# Patient Record
Sex: Male | Born: 1945
Health system: Southern US, Community
[De-identification: ages and names within clinical notes are randomized; demographics above are authoritative.]

## PROBLEM LIST (undated history)

## (undated) DIAGNOSIS — M1990 Unspecified osteoarthritis, unspecified site: Secondary | ICD-10-CM

## (undated) DIAGNOSIS — R011 Cardiac murmur, unspecified: Secondary | ICD-10-CM

## (undated) DIAGNOSIS — N529 Male erectile dysfunction, unspecified: Secondary | ICD-10-CM

## (undated) DIAGNOSIS — D126 Benign neoplasm of colon, unspecified: Secondary | ICD-10-CM

## (undated) DIAGNOSIS — K219 Gastro-esophageal reflux disease without esophagitis: Secondary | ICD-10-CM

## (undated) DIAGNOSIS — K5790 Diverticulosis of intestine, part unspecified, without perforation or abscess without bleeding: Secondary | ICD-10-CM

## (undated) DIAGNOSIS — K648 Other hemorrhoids: Secondary | ICD-10-CM

## (undated) DIAGNOSIS — G5790 Unspecified mononeuropathy of unspecified lower limb: Secondary | ICD-10-CM

## (undated) DIAGNOSIS — G4730 Sleep apnea, unspecified: Secondary | ICD-10-CM

## (undated) DIAGNOSIS — E785 Hyperlipidemia, unspecified: Secondary | ICD-10-CM

## (undated) DIAGNOSIS — I10 Essential (primary) hypertension: Secondary | ICD-10-CM

## (undated) DIAGNOSIS — K602 Anal fissure, unspecified: Secondary | ICD-10-CM

## (undated) DIAGNOSIS — N400 Enlarged prostate without lower urinary tract symptoms: Secondary | ICD-10-CM

## (undated) HISTORY — DX: Benign neoplasm of colon, unspecified: D12.6

## (undated) HISTORY — DX: Hyperlipidemia, unspecified: E78.5

## (undated) HISTORY — DX: Unspecified mononeuropathy of unspecified lower limb: G57.90

## (undated) HISTORY — DX: Anal fissure, unspecified: K60.2

## (undated) HISTORY — PX: ANAL FISSURE REPAIR: SHX2312

## (undated) HISTORY — DX: Male erectile dysfunction, unspecified: N52.9

## (undated) HISTORY — DX: Unspecified osteoarthritis, unspecified site: M19.90

## (undated) HISTORY — PX: FOOT FRACTURE SURGERY: SHX645

## (undated) HISTORY — DX: Other hemorrhoids: K64.8

## (undated) HISTORY — PX: JOINT REPLACEMENT: SHX530

## (undated) HISTORY — DX: Benign prostatic hyperplasia without lower urinary tract symptoms: N40.0

## (undated) HISTORY — DX: Diverticulosis of intestine, part unspecified, without perforation or abscess without bleeding: K57.90

---

## 2000-05-05 ENCOUNTER — Emergency Department (HOSPITAL_COMMUNITY): Admission: EM | Admit: 2000-05-05 | Discharge: 2000-05-05 | Payer: Self-pay | Admitting: Emergency Medicine

## 2000-05-05 ENCOUNTER — Encounter: Payer: Self-pay | Admitting: Emergency Medicine

## 2002-12-09 ENCOUNTER — Encounter: Payer: Self-pay | Admitting: Specialist

## 2002-12-14 ENCOUNTER — Encounter: Payer: Self-pay | Admitting: Specialist

## 2002-12-16 ENCOUNTER — Inpatient Hospital Stay (HOSPITAL_COMMUNITY): Admission: RE | Admit: 2002-12-16 | Discharge: 2002-12-20 | Payer: Self-pay | Admitting: Specialist

## 2004-06-26 ENCOUNTER — Ambulatory Visit (HOSPITAL_COMMUNITY): Admission: RE | Admit: 2004-06-26 | Discharge: 2004-06-26 | Payer: Self-pay | Admitting: Internal Medicine

## 2004-06-27 ENCOUNTER — Ambulatory Visit (HOSPITAL_COMMUNITY): Admission: RE | Admit: 2004-06-27 | Discharge: 2004-06-27 | Payer: Self-pay | Admitting: Internal Medicine

## 2004-07-03 ENCOUNTER — Encounter: Admission: RE | Admit: 2004-07-03 | Discharge: 2004-07-03 | Payer: Self-pay | Admitting: Orthopedic Surgery

## 2005-06-13 ENCOUNTER — Ambulatory Visit (HOSPITAL_COMMUNITY): Admission: RE | Admit: 2005-06-13 | Discharge: 2005-06-13 | Payer: Self-pay | Admitting: Orthopaedic Surgery

## 2007-12-27 ENCOUNTER — Ambulatory Visit: Payer: Self-pay | Admitting: Internal Medicine

## 2008-01-10 ENCOUNTER — Ambulatory Visit: Payer: Self-pay | Admitting: Gastroenterology

## 2008-02-19 ENCOUNTER — Inpatient Hospital Stay (HOSPITAL_COMMUNITY): Admission: EM | Admit: 2008-02-19 | Discharge: 2008-02-20 | Payer: Self-pay | Admitting: Emergency Medicine

## 2008-05-22 ENCOUNTER — Ambulatory Visit: Payer: Self-pay | Admitting: Vascular Surgery
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
## 2011-04-01 NOTE — Procedures (Signed)
DUPLEX DEEP VENOUS EXAM - LOWER EXTREMITY   INDICATION:  Left calf pain and swelling.   HISTORY:  Edema:  No.  Trauma/Surgery:  Hit medial left calf on bulldozer one week ago.  Pain:  Left calf.  PE:  No.  Previous DVT:  No.  Anticoagulants:  Other:   DUPLEX EXAM:                CFV   SFV   PopV  PTV    GSV                R  L  R  L  R  L  R   L  R  L  Thrombosis    o  o     o     o      o     o  Spontaneous   +  +     +     +      +     +  Phasic        +  +     +     +      +     +  Augmentation  +  +     +     +      +     +  Compressible  +  +     +     +      +     +  Competent   Legend:  + - yes  o - no  p - partial  D - decreased   IMPRESSION:  1. No evidence of deep venous thrombosis noted in the left lower      extremity.  2. Cystic structure with internal debris measuring 4 x 3.4 x 1.3 cm,      noted at the medial mid-to-distal left calf level, which is most      likely a hematoma.  3. Homogenous echo-textured structure with cystic components measuring      6 x 3.6 x 3.6 cm, noted in the left popliteal fossa, which is most      likely evidence of a ruptured baker's cyst.   Results were called and given to Myrene Buddy at Dr. Laurey Morale office at 1:50  on 05/22/08.    _____________________________  Janetta Hora. Fields, MD   CH/MEDQ  D:  05/22/2008  T:  05/22/2008  Job:  [id number]
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
## 2011-08-12 LAB — POCT I-STAT, CHEM 8
Chloride: 105
Creatinine, Ser: 1.3
Sodium: 138
TCO2: 22

## 2011-08-12 LAB — COMPREHENSIVE METABOLIC PANEL
ALT: 36
Albumin: 3.8
BUN: 10
Chloride: 102
Creatinine, Ser: 0.78
GFR calc Af Amer: >60
Glucose, Bld: 105 — ABNORMAL HIGH
Sodium: 135

## 2011-08-12 LAB — CBC
HCT: 41.6
Hemoglobin: 11.1 — ABNORMAL LOW
MCHC: 34.7
MCV: 92.3
MCV: 93.1
Platelets: 146 — ABNORMAL LOW
RDW: 13.3

## 2011-12-02 ENCOUNTER — Encounter (HOSPITAL_COMMUNITY): Payer: Self-pay | Admitting: Pharmacy Technician

## 2011-12-03 ENCOUNTER — Other Ambulatory Visit: Payer: Self-pay | Admitting: Pain Medicine

## 2011-12-09 ENCOUNTER — Other Ambulatory Visit (HOSPITAL_COMMUNITY): Payer: BC Managed Care – PPO

## 2011-12-10 ENCOUNTER — Ambulatory Visit (HOSPITAL_COMMUNITY)
Admission: RE | Admit: 2011-12-10 | Discharge: 2011-12-10 | Disposition: A | Discharge status: Home / Self Care | Payer: BC Managed Care – PPO | Source: Ambulatory Visit | Attending: Specialist | Admitting: Specialist

## 2011-12-10 ENCOUNTER — Encounter (HOSPITAL_COMMUNITY)
Admission: RE | Admit: 2011-12-10 | Discharge: 2011-12-10 | Disposition: A | Discharge status: Home / Self Care | Payer: BC Managed Care – PPO | Source: Ambulatory Visit | Attending: Specialist | Admitting: Specialist

## 2011-12-10 ENCOUNTER — Encounter (HOSPITAL_COMMUNITY): Payer: Self-pay

## 2011-12-10 DIAGNOSIS — I517 Cardiomegaly: Secondary | ICD-10-CM | POA: Insufficient documentation

## 2011-12-10 DIAGNOSIS — Z01812 Encounter for preprocedural laboratory examination: Secondary | ICD-10-CM | POA: Insufficient documentation

## 2011-12-10 DIAGNOSIS — M1710 Unilateral primary osteoarthritis, unspecified knee: Secondary | ICD-10-CM | POA: Insufficient documentation

## 2011-12-10 DIAGNOSIS — Z01818 Encounter for other preprocedural examination: Secondary | ICD-10-CM | POA: Insufficient documentation

## 2011-12-10 HISTORY — DX: Sleep apnea, unspecified: G47.30

## 2011-12-10 HISTORY — DX: Essential (primary) hypertension: I10

## 2011-12-10 HISTORY — DX: Gastro-esophageal reflux disease without esophagitis: K21.9

## 2011-12-10 HISTORY — DX: Unspecified osteoarthritis, unspecified site: M19.90

## 2011-12-10 HISTORY — DX: Cardiac murmur, unspecified: R01.1

## 2011-12-10 LAB — CBC
HCT: 40.9 % (ref 39.0–52.0)
MCH: 32.6 pg (ref 26.0–34.0)
MCV: 92.5 fL (ref 78.0–100.0)
Platelets: 206 10*3/uL (ref 150–400)
RBC: 4.42 MIL/uL (ref 4.22–5.81)
RDW: 13.3 % (ref 11.5–15.5)
WBC: 6.5 10*3/uL (ref 4.0–10.5)

## 2011-12-10 LAB — URINALYSIS, ROUTINE W REFLEX MICROSCOPIC
Ketones, ur: NEGATIVE mg/dL
Leukocytes, UA: NEGATIVE
Nitrite: NEGATIVE
Protein, ur: NEGATIVE mg/dL
Urobilinogen, UA: 0.2 mg/dL (ref 0.0–1.0)

## 2011-12-10 LAB — COMPREHENSIVE METABOLIC PANEL
BUN: 16 mg/dL (ref 6–23)
CO2: 28 mEq/L (ref 19–32)
Calcium: 10.1 mg/dL (ref 8.4–10.5)
Chloride: 101 mEq/L (ref 96–112)
Creatinine, Ser: 0.81 mg/dL (ref 0.50–1.35)
GFR calc non Af Amer: >90 mL/min (ref >90)
Total Bilirubin: 0.4 mg/dL (ref 0.3–1.2)

## 2011-12-10 LAB — DIFFERENTIAL
Basophils Absolute: 0 10*3/uL (ref 0.0–0.1)
Basophils Relative: 1 % (ref 0–1)
Lymphocytes Relative: 21 % (ref 12–46)
Neutro Abs: 4.3 10*3/uL (ref 1.7–7.7)
Neutrophils Relative %: 65 % (ref 43–77)

## 2011-12-10 LAB — PROTIME-INR
INR: 0.97 (ref 0.00–1.49)
Prothrombin Time: 13.1 seconds (ref 11.6–15.2)

## 2011-12-10 MED ORDER — CEFAZOLIN SODIUM 1-5 GM-% IV SOLN: 1.0000 g | INTRAVENOUS | Status: DC

## 2011-12-10 NOTE — H&P (Signed)
NAME:  [name], [name] NO.:  [id number]  MEDICAL RECORD NO.:  [id number]  LOCATION:                               FACILITY:  [facility]  PHYSICIAN:  Erasmo Leventhal, M.D.DATE OF BIRTH:  03-22-1946  DATE OF ADMISSION:  12/12/2011 DATE OF DISCHARGE:                             HISTORY & PHYSICAL   CHIEF COMPLAINT:  Painful loss of range of motion to his right knee.  HISTORY OF PRESENT ILLNESS:  The patient is a 66 year old gentleman, well-known to Dr. Thomasena Edis for evaluation and treatment of his knee pain. The patient had a total knee arthroplasty on his left foot 9 years previous.  He has done real well.  The patient continued to have pain, difficulty with weight bearing, range of motion in his right knee.  He has failed conservative treatment.  The patient has done so well with his left knee.  He would like to proceed with a total knee arthroplasty on his right.  X-rays reveal he has significant arthritic change in the medial compartment with varus deformity and degenerative changes under his patella facet.  He has been cleared by his primary care physician, Dr. Rodrigo Ran.  He has also recently had a cardiac stress test by Dr. Donnie Aho.  PRIMARY CARE PHYSICIAN:  Mark A. Perini, MD  CARDIOLOGY:  Recent evaluation by Georga Hacking, MD  ALLERGIES:  None.  CURRENT MEDICATIONS: 1. Crestor 20 mg once a day. 2. Ziac 25 mg once a day. 3. __________ 10 mg once a day. 4. Lotrel 5/20 mg once a day. 5. Gabapentin 100 mg 3 times a day. 6. Fish oil 3 tablets a day. 7. Osteo Bi-Flex once a day. 8. Aspirin 81 mg a day on hold. 9. Vitamin B12 1000 mg a day. 10.Vitamin D3 1000 mg a day. 11.Cardura 4 mg tablets one to one-half tablet p.r.n. increased blood     pressure, does not use frequently.  PAST MEDICAL HISTORY: 1. Hypertension. 2. Sleep apnea with nasal cannula at 2 L, use nightly.  REVIEW OF SYSTEMS:  NEUROLOGIC:  He denies any strokes,  seizures, convulsions, numbness or tingling, impaired vision, or memory.  No alcohol or drug abuse in the past.  No anxiety or depression issues. PULMONARY:  He does have sleep apnea.  He just uses nasal cannula at night 2 L/minute.  He denies any problems with wheezing, productive cough, shortness of breath, or history of tuberculosis.  CARDIOVASCULAR: He denies any chest pains, irregular heart rhythms.  No shortness of breath with activity.  No PND.  No previous heart surgery or catheterizations.  He did have a stress test 3 weeks ago for this upcoming surgery which was unremarkable by Dr. Donnie Aho.  GI:  Denies any acid reflux, heartburn, indigestion, constipation, diarrhea.  No problems with his liver, gallbladder, or colon.  GU:  He denies any problems with burning on urination.  No prostate issues.  No kidney stones.  No kidney failure.  ENDOCRINE:  He denies any problems related to his thyroid or diabetes.  PAST SURGICAL HISTORY:  Left knee replacement 9 years previous without any complications.  He denies any issues with anesthesia.  FAMILY MEDICAL HISTORY:  Father  is deceased from a stroke at age of 36. Mother is deceased from heart disease at 93.  SOCIAL HISTORY:  He is married.  He has smoked in the past.  He has 3-4 alcoholic beverages daily.  He denies any issues related to withdrawal. He has got 3 grown children.  He will be returning home for postoperative care.  PHYSICAL EXAMINATION:  VITAL SIGNS:  Height is 5 feet 8 inches, weight is 255 pounds, blood pressure is 128/78, pulse of 70 and regular, respirations 12 nonlabored.  The patient is afebrile. GENERAL:  Short stature, heavy.  The patient is conscious, alert and appropriate, healthy-appearing gentleman, walks with a slight right- sided limp. HEENT:  Head is normocephalic.  Pupils equal.  Gross hearing is intact. NECK:  Supple.  Good range of motion.  No palpable lymphadenopathy. HEART:  Regular rate and rhythm.   No murmurs. LUNGS:  Clear throughout. ABDOMEN:  Round, soft, bowel sounds present. EXTREMITIES:  Upper extremities had good range of motion with good motor strength.  Lower extremities, he had good range of motion both hips. His right knee, he had painful full extension, he was able to flex it back to 120.  He had pain along the medial joint line.  No pain laterally.  He had no ligament instability.  Calf was soft and nontender.  His left knee had full extension, flexion to 120.  No ligament instability.  Calf was soft.  Good motion of both ankles. PERIPHERAL VASCULAR:  Carotid pulses were 2+.  No bruits.  Radial pulses were 2+.  Posterior tibial pulses were 1+.  He had no lower extremity edema noted. BREAST/RECTAL/GU:  Deferred at this time.  IMPRESSION: 1. Advanced osteoarthritis, right knee. 2. Hypertension. 3. Sleep apnea.  PLAN:  The patient will undergo all routine labs and tests prior to having a right total knee arthroplasty by Dr. Thomasena Edis on December 12, 2011.     Jamelle Rushing, P.A.   ______________________________ Erasmo Leventhal, M.D.    RWK/MEDQ  D:  12/10/2011  T:  12/10/2011  Job:  [id number]

## 2011-12-10 NOTE — Pre-Procedure Instructions (Signed)
12/10/11 Nurse added to consent - arthroplasty after right total knee.  Please verify with MD.

## 2011-12-10 NOTE — Patient Instructions (Signed)
20 [name]  12/10/2011   Your procedure is scheduled on:  12/12/11 1230pm-225pm  Report to [facility] at 1030 AM.  Call this number if you have problems the morning of surgery: [phone]   Remember:   Do not eat food:After Midnight.  May have clear liquids:until Midnight .    Take these medicines the morning of surgery with A SIP OF WATER:    Do not wear jewelry,   Do not wear lotions, powders, or perfumes.     Do not bring valuables to the hospital.  Contacts, dentures or bridgework may not be worn into surgery.  Leave suitcase in the car. After surgery it may be brought to your room.  For patients admitted to the hospital, checkout time is 11:00 AM the day of discharge.       Special Instructions: CHG Shower Use Special Wash: 1/2 bottle night before surgery and 1/2 bottle morning of surgery. shower chin to toes with CHG.  Wash face and private parts with regular soap.     Please read over the following fact sheets that you were given: MRSA Information, Incentive Spirometry Fact Sheet, Blood Transfusion Fact Sheet, coughing and deep breathing exercises, leg exercises

## 2011-12-10 NOTE — Pre-Procedure Instructions (Signed)
12/10/11 Consent form did not list reason except for had Dr Thomasena Edis name and procedure.  Nurse put under reason- osteoarthritis right knee.  Please verify with MD.

## 2011-12-10 NOTE — Pre-Procedure Instructions (Signed)
Office visit with cardiologist done 10/28/11 on chart - Dr Donnie Aho  10/30/11 Cardiolite results on chart  11/24/11 Office visit with PCP on chart

## 2011-12-12 ENCOUNTER — Encounter (HOSPITAL_COMMUNITY)
Admission: RE | Disposition: A | Discharge status: Home Health | Payer: Self-pay | Source: Ambulatory Visit | Attending: Specialist

## 2011-12-12 ENCOUNTER — Encounter (HOSPITAL_COMMUNITY): Payer: Self-pay | Admitting: *Deleted

## 2011-12-12 ENCOUNTER — Inpatient Hospital Stay (HOSPITAL_COMMUNITY)
Admission: RE | Admit: 2011-12-12 | Discharge: 2011-12-15 | DRG: 209 | Disposition: A | Discharge status: Home Health | Payer: BC Managed Care – PPO | Source: Ambulatory Visit | Attending: Specialist | Admitting: Specialist

## 2011-12-12 ENCOUNTER — Encounter (HOSPITAL_COMMUNITY): Payer: Self-pay | Admitting: Anesthesiology

## 2011-12-12 ENCOUNTER — Inpatient Hospital Stay (HOSPITAL_COMMUNITY): Payer: BC Managed Care – PPO | Admitting: Anesthesiology

## 2011-12-12 DIAGNOSIS — M1710 Unilateral primary osteoarthritis, unspecified knee: Principal | ICD-10-CM | POA: Diagnosis present

## 2011-12-12 DIAGNOSIS — I10 Essential (primary) hypertension: Secondary | ICD-10-CM | POA: Diagnosis present

## 2011-12-12 DIAGNOSIS — G4730 Sleep apnea, unspecified: Secondary | ICD-10-CM | POA: Diagnosis present

## 2011-12-12 DIAGNOSIS — Z87891 Personal history of nicotine dependence: Secondary | ICD-10-CM

## 2011-12-12 DIAGNOSIS — E871 Hypo-osmolality and hyponatremia: Secondary | ICD-10-CM | POA: Diagnosis present

## 2011-12-12 DIAGNOSIS — Z96659 Presence of unspecified artificial knee joint: Secondary | ICD-10-CM

## 2011-12-12 DIAGNOSIS — E785 Hyperlipidemia, unspecified: Secondary | ICD-10-CM | POA: Diagnosis present

## 2011-12-12 HISTORY — PX: TOTAL KNEE ARTHROPLASTY: SHX125

## 2011-12-12 LAB — TYPE AND SCREEN: Antibody Screen: NEGATIVE

## 2011-12-12 SURGERY — ARTHROPLASTY, KNEE, TOTAL
Anesthesia: General | Site: Knee | Laterality: Right | Wound class: Clean

## 2011-12-12 MED ORDER — ONDANSETRON HCL 4 MG PO TABS: 4.0000 mg | ORAL_TABLET | Freq: Four times a day (QID) | ORAL | Status: DC | PRN

## 2011-12-12 MED ORDER — BISACODYL 10 MG RE SUPP: 10.0000 mg | Freq: Every day | RECTAL | Status: DC | PRN

## 2011-12-12 MED ORDER — DOCUSATE SODIUM 100 MG PO CAPS
100.0000 mg | ORAL_CAPSULE | Freq: Two times a day (BID) | ORAL | Status: DC
  Administered 2011-12-12 – 2011-12-15 (×6): 100 mg via ORAL
  Filled 2011-12-12 (×7): qty 1

## 2011-12-12 MED ORDER — ONDANSETRON HCL 4 MG/2ML IJ SOLN
INTRAMUSCULAR | Status: DC | PRN
  Administered 2011-12-12 (×2): 2 mg via INTRAVENOUS

## 2011-12-12 MED ORDER — ACETAMINOPHEN 10 MG/ML IV SOLN
INTRAVENOUS | Status: DC | PRN
  Administered 2011-12-12: 1000 mg via INTRAVENOUS

## 2011-12-12 MED ORDER — FENTANYL CITRATE 0.05 MG/ML IJ SOLN
INTRAMUSCULAR | Status: DC | PRN
  Administered 2011-12-12 (×2): 50 ug via INTRAVENOUS
  Administered 2011-12-12: 25 ug via INTRAVENOUS

## 2011-12-12 MED ORDER — PHENOL 1.4 % MT LIQD
1.0000 | OROMUCOSAL | Status: DC | PRN
  Filled 2011-12-12: qty 177

## 2011-12-12 MED ORDER — CHLORHEXIDINE GLUCONATE 4 % EX LIQD: 60.0000 mL | Freq: Once | CUTANEOUS | Status: DC

## 2011-12-12 MED ORDER — PROMETHAZINE HCL 25 MG/ML IJ SOLN: 6.2500 mg | INTRAMUSCULAR | Status: DC | PRN

## 2011-12-12 MED ORDER — HEMOSTATIC AGENTS (NO CHARGE) OPTIME
TOPICAL | Status: DC | PRN
  Administered 2011-12-12: 1 via TOPICAL

## 2011-12-12 MED ORDER — DIPHENHYDRAMINE HCL 12.5 MG/5ML PO ELIX: 12.5000 mg | ORAL_SOLUTION | ORAL | Status: DC | PRN

## 2011-12-12 MED ORDER — SENNOSIDES-DOCUSATE SODIUM 8.6-50 MG PO TABS
1.0000 | ORAL_TABLET | Freq: Every evening | ORAL | Status: DC | PRN
  Filled 2011-12-12: qty 1

## 2011-12-12 MED ORDER — FLEET ENEMA 7-19 GM/118ML RE ENEM: 1.0000 | ENEMA | Freq: Once | RECTAL | Status: AC | PRN

## 2011-12-12 MED ORDER — EZETIMIBE 10 MG PO TABS
5.0000 mg | ORAL_TABLET | Freq: Every day | ORAL | Status: DC
  Administered 2011-12-13 – 2011-12-15 (×3): 5 mg via ORAL
  Filled 2011-12-12 (×3): qty 0.5

## 2011-12-12 MED ORDER — MIDAZOLAM HCL 5 MG/5ML IJ SOLN
INTRAMUSCULAR | Status: DC | PRN
  Administered 2011-12-12: 2 mg via INTRAVENOUS

## 2011-12-12 MED ORDER — ENOXAPARIN SODIUM 30 MG/0.3ML ~~LOC~~ SOLN
30.0000 mg | Freq: Two times a day (BID) | SUBCUTANEOUS | Status: DC
  Administered 2011-12-13 – 2011-12-15 (×5): 30 mg via SUBCUTANEOUS
  Filled 2011-12-12 (×6): qty 0.3

## 2011-12-12 MED ORDER — ATROPINE SULFATE 0.4 MG/ML IJ SOLN
INTRAMUSCULAR | Status: DC | PRN
  Administered 2011-12-12: 0.2 mg via INTRAVENOUS

## 2011-12-12 MED ORDER — VITAMIN D3 25 MCG (1000 UNIT) PO TABS
1000.0000 [IU] | ORAL_TABLET | Freq: Every day | ORAL | Status: DC
  Administered 2011-12-13 – 2011-12-15 (×3): 1000 [IU] via ORAL
  Filled 2011-12-12 (×3): qty 1

## 2011-12-12 MED ORDER — FLUTICASONE PROPIONATE 50 MCG/ACT NA SUSP
2.0000 | Freq: Every day | NASAL | Status: DC
  Administered 2011-12-12 – 2011-12-15 (×4): 2 via NASAL
  Filled 2011-12-12: qty 16

## 2011-12-12 MED ORDER — AMLODIPINE BESYLATE 5 MG PO TABS
5.0000 mg | ORAL_TABLET | Freq: Two times a day (BID) | ORAL | Status: DC
  Administered 2011-12-12 – 2011-12-15 (×6): 5 mg via ORAL
  Filled 2011-12-12 (×7): qty 1

## 2011-12-12 MED ORDER — LACTATED RINGERS IV SOLN: INTRAVENOUS | Status: DC

## 2011-12-12 MED ORDER — FERROUS SULFATE 325 (65 FE) MG PO TABS
325.0000 mg | ORAL_TABLET | Freq: Three times a day (TID) | ORAL | Status: DC
  Administered 2011-12-12 – 2011-12-15 (×8): 325 mg via ORAL
  Filled 2011-12-12 (×10): qty 1

## 2011-12-12 MED ORDER — POTASSIUM CHLORIDE IN NACL 20-0.9 MEQ/L-% IV SOLN
INTRAVENOUS | Status: DC
  Administered 2011-12-13: 03:00:00 via INTRAVENOUS
  Administered 2011-12-13: 1000 mL via INTRAVENOUS
  Filled 2011-12-12 (×4): qty 1000

## 2011-12-12 MED ORDER — CEFAZOLIN SODIUM-DEXTROSE 2-3 GM-% IV SOLR
2.0000 g | INTRAVENOUS | Status: AC
  Administered 2011-12-12: 2 g via INTRAVENOUS

## 2011-12-12 MED ORDER — GLYCOPYRROLATE 0.2 MG/ML IJ SOLN
INTRAMUSCULAR | Status: DC | PRN
  Administered 2011-12-12: 0.2 mg via INTRAVENOUS

## 2011-12-12 MED ORDER — METHOCARBAMOL 500 MG PO TABS
500.0000 mg | ORAL_TABLET | Freq: Four times a day (QID) | ORAL | Status: DC | PRN
  Administered 2011-12-12 – 2011-12-15 (×7): 500 mg via ORAL
  Filled 2011-12-12 (×7): qty 1

## 2011-12-12 MED ORDER — HYDROMORPHONE HCL PF 1 MG/ML IJ SOLN
0.5000 mg | INTRAMUSCULAR | Status: DC | PRN
  Administered 2011-12-12: 1 mg via INTRAVENOUS
  Administered 2011-12-12: 0.5 mg via INTRAVENOUS
  Administered 2011-12-12: 1 mg via INTRAVENOUS
  Filled 2011-12-12 (×3): qty 1

## 2011-12-12 MED ORDER — VANCOMYCIN HCL 1000 MG IV SOLR
1000.0000 mg | INTRAVENOUS | Status: DC | PRN
  Administered 2011-12-12: 1000 mg via INTRAVENOUS

## 2011-12-12 MED ORDER — SODIUM CHLORIDE 0.9 % IV SOLN: INTRAVENOUS | Status: DC

## 2011-12-12 MED ORDER — BISOPROLOL-HYDROCHLOROTHIAZIDE 2.5-6.25 MG PO TABS
1.0000 | ORAL_TABLET | Freq: Every day | ORAL | Status: DC
  Administered 2011-12-13 – 2011-12-15 (×3): 1 via ORAL
  Filled 2011-12-12 (×4): qty 1

## 2011-12-12 MED ORDER — MENTHOL 3 MG MT LOZG
1.0000 | LOZENGE | OROMUCOSAL | Status: DC | PRN
  Filled 2011-12-12: qty 9

## 2011-12-12 MED ORDER — BISOPROLOL FUMARATE 5 MG PO TABS
2.5000 mg | ORAL_TABLET | Freq: Once | ORAL | Status: AC
  Administered 2011-12-12: 2.5 mg via ORAL
  Filled 2011-12-12: qty 0.5

## 2011-12-12 MED ORDER — ONDANSETRON HCL 4 MG/2ML IJ SOLN
4.0000 mg | Freq: Four times a day (QID) | INTRAMUSCULAR | Status: DC | PRN
  Administered 2011-12-12: 4 mg via INTRAVENOUS
  Filled 2011-12-12: qty 2

## 2011-12-12 MED ORDER — SODIUM CHLORIDE 0.9 % IV SOLN
1500.0000 mg | INTRAVENOUS | Status: DC
  Filled 2011-12-12: qty 1500

## 2011-12-12 MED ORDER — SODIUM CHLORIDE 0.9 % IR SOLN
Status: DC | PRN
  Administered 2011-12-12: 1000 mL

## 2011-12-12 MED ORDER — LACTATED RINGERS IV SOLN
INTRAVENOUS | Status: DC | PRN
  Administered 2011-12-12 (×2): via INTRAVENOUS

## 2011-12-12 MED ORDER — HYDROMORPHONE HCL PF 1 MG/ML IJ SOLN: 0.2500 mg | INTRAMUSCULAR | Status: DC | PRN

## 2011-12-12 MED ORDER — ROSUVASTATIN CALCIUM 20 MG PO TABS
20.0000 mg | ORAL_TABLET | Freq: Every day | ORAL | Status: DC
Start: 2011-12-13 — End: 2011-12-15
  Administered 2011-12-13 – 2011-12-15 (×3): 20 mg via ORAL
  Filled 2011-12-12 (×3): qty 1

## 2011-12-12 MED ORDER — BUPIVACAINE IN DEXTROSE 0.75-8.25 % IT SOLN
INTRATHECAL | Status: DC | PRN
  Administered 2011-12-12: 2 mL via INTRATHECAL

## 2011-12-12 MED ORDER — METOCLOPRAMIDE HCL 5 MG/ML IJ SOLN: 5.0000 mg | Freq: Three times a day (TID) | INTRAMUSCULAR | Status: DC | PRN

## 2011-12-12 MED ORDER — VITAMIN B-12 1000 MCG PO TABS
1000.0000 ug | ORAL_TABLET | Freq: Every day | ORAL | Status: DC
  Administered 2011-12-13 – 2011-12-15 (×3): 1000 ug via ORAL
  Filled 2011-12-12 (×3): qty 1

## 2011-12-12 MED ORDER — ZOLPIDEM TARTRATE 5 MG PO TABS: 5.0000 mg | ORAL_TABLET | Freq: Every evening | ORAL | Status: DC | PRN

## 2011-12-12 MED ORDER — BENAZEPRIL HCL 20 MG PO TABS
20.0000 mg | ORAL_TABLET | Freq: Two times a day (BID) | ORAL | Status: DC
  Administered 2011-12-12 – 2011-12-15 (×6): 20 mg via ORAL
  Filled 2011-12-12 (×7): qty 1

## 2011-12-12 MED ORDER — ACETAMINOPHEN 325 MG PO TABS: 650.0000 mg | ORAL_TABLET | Freq: Four times a day (QID) | ORAL | Status: DC | PRN

## 2011-12-12 MED ORDER — OXYCODONE-ACETAMINOPHEN 5-325 MG PO TABS
1.0000 | ORAL_TABLET | ORAL | Status: DC | PRN
  Administered 2011-12-12 – 2011-12-14 (×10): 2 via ORAL
  Administered 2011-12-15: 1 via ORAL
  Administered 2011-12-15: 2 via ORAL
  Filled 2011-12-12: qty 1
  Filled 2011-12-12 (×11): qty 2

## 2011-12-12 MED ORDER — PROPOFOL 10 MG/ML IV EMUL
INTRAVENOUS | Status: DC | PRN
  Administered 2011-12-12: 40 ug/kg/min via INTRAVENOUS

## 2011-12-12 MED ORDER — PANTOPRAZOLE SODIUM 40 MG PO TBEC
40.0000 mg | DELAYED_RELEASE_TABLET | Freq: Every day | ORAL | Status: DC
  Administered 2011-12-12 – 2011-12-14 (×3): 40 mg via ORAL
  Filled 2011-12-12 (×4): qty 1

## 2011-12-12 MED ORDER — GABAPENTIN 300 MG PO CAPS
300.0000 mg | ORAL_CAPSULE | Freq: Three times a day (TID) | ORAL | Status: DC
  Administered 2011-12-12 – 2011-12-15 (×8): 300 mg via ORAL
  Filled 2011-12-12 (×10): qty 1

## 2011-12-12 MED ORDER — POVIDONE-IODINE 7.5 % EX SOLN: Freq: Once | CUTANEOUS | Status: DC

## 2011-12-12 MED ORDER — VANCOMYCIN HCL IN DEXTROSE 1-5 GM/200ML-% IV SOLN: 1000.0000 mg | Freq: Once | INTRAVENOUS | Status: DC

## 2011-12-12 MED ORDER — METOCLOPRAMIDE HCL 10 MG PO TABS: 5.0000 mg | ORAL_TABLET | Freq: Three times a day (TID) | ORAL | Status: DC | PRN

## 2011-12-12 MED ORDER — LIDOCAINE HCL (CARDIAC) 20 MG/ML IV SOLN
INTRAVENOUS | Status: DC | PRN
  Administered 2011-12-12: 40 mg via INTRAVENOUS

## 2011-12-12 MED ORDER — VANCOMYCIN HCL IN DEXTROSE 1-5 GM/200ML-% IV SOLN
1000.0000 mg | Freq: Two times a day (BID) | INTRAVENOUS | Status: AC
  Administered 2011-12-13: 1000 mg via INTRAVENOUS
  Filled 2011-12-12: qty 200

## 2011-12-12 MED ORDER — AMLODIPINE BESY-BENAZEPRIL HCL 5-20 MG PO CAPS: 1.0000 | ORAL_CAPSULE | Freq: Two times a day (BID) | ORAL | Status: DC

## 2011-12-12 MED ORDER — ALUM & MAG HYDROXIDE-SIMETH 200-200-20 MG/5ML PO SUSP: 30.0000 mL | ORAL | Status: DC | PRN

## 2011-12-12 MED ORDER — ACETAMINOPHEN 650 MG RE SUPP: 650.0000 mg | Freq: Four times a day (QID) | RECTAL | Status: DC | PRN

## 2011-12-12 MED ORDER — METHOCARBAMOL 100 MG/ML IJ SOLN
500.0000 mg | Freq: Four times a day (QID) | INTRAVENOUS | Status: DC | PRN
  Administered 2011-12-12: 500 mg via INTRAVENOUS
  Filled 2011-12-12 (×2): qty 5

## 2011-12-12 SURGICAL SUPPLY — 70 items
BAG ZIPLOCK 12X15 (MISCELLANEOUS) ×4 IMPLANT
BANDAGE ACE 4 STERILE (GAUZE/BANDAGES/DRESSINGS) ×2 IMPLANT
BANDAGE ELASTIC 4 VELCRO ST LF (GAUZE/BANDAGES/DRESSINGS) ×2 IMPLANT
BANDAGE ELASTIC 6 VELCRO ST LF (GAUZE/BANDAGES/DRESSINGS) ×2 IMPLANT
BANDAGE ESMARK 6X9 LF (GAUZE/BANDAGES/DRESSINGS) ×1 IMPLANT
BANDAGE GAUZE ELAST BULKY 4 IN (GAUZE/BANDAGES/DRESSINGS) ×4 IMPLANT
BLADE SAG 18X100X1.27 (BLADE) ×2 IMPLANT
BLADE SAW SGTL 13.0X1.19X90.0M (BLADE) ×2 IMPLANT
BNDG ESMARK 6X9 LF (GAUZE/BANDAGES/DRESSINGS) ×2
BONE CEMENT GENTAMICIN (Cement) ×4 IMPLANT
CEMENT BONE GENTAMICIN 40 (Cement) ×2 IMPLANT
CEMENT HV SMART SET (Cement) IMPLANT
CLOTH BEACON ORANGE TIMEOUT ST (SAFETY) ×2 IMPLANT
CUFF TOURN SGL QUICK 34 (TOURNIQUET CUFF) ×1
CUFF TRNQT CYL 34X4X40X1 (TOURNIQUET CUFF) ×1 IMPLANT
DRAPE EXTREMITY T 121X128X90 (DRAPE) ×2 IMPLANT
DRAPE LG THREE QUARTER DISP (DRAPES) ×2 IMPLANT
DRAPE POUCH INSTRU U-SHP 10X18 (DRAPES) ×2 IMPLANT
DRAPE U-SHAPE 47X51 STRL (DRAPES) ×2 IMPLANT
DRSG PAD ABDOMINAL 8X10 ST (GAUZE/BANDAGES/DRESSINGS) ×2 IMPLANT
DURAPREP 26ML APPLICATOR (WOUND CARE) ×2 IMPLANT
ELECT REM PT RETURN 9FT ADLT (ELECTROSURGICAL) ×2
ELECTRODE REM PT RTRN 9FT ADLT (ELECTROSURGICAL) ×1 IMPLANT
EVACUATOR 1/8 PVC DRAIN (DRAIN) ×2 IMPLANT
FACESHIELD LNG OPTICON STERILE (SAFETY) ×10 IMPLANT
GAUZE KERLIX 2  STERILE LF (GAUZE/BANDAGES/DRESSINGS) ×2 IMPLANT
GAUZE XEROFORM 2X2 STRL (GAUZE/BANDAGES/DRESSINGS) ×2 IMPLANT
GLOVE BIOGEL PI IND STRL 6.5 (GLOVE) ×1 IMPLANT
GLOVE BIOGEL PI IND STRL 7.5 (GLOVE) ×1 IMPLANT
GLOVE BIOGEL PI INDICATOR 6.5 (GLOVE) ×1
GLOVE BIOGEL PI INDICATOR 7.5 (GLOVE) ×1
GLOVE ECLIPSE 8.0 STRL XLNG CF (GLOVE) ×2 IMPLANT
GLOVE SURG ORTHO 8.0 STRL STRW (GLOVE) ×2 IMPLANT
GLOVE SURG ORTHO 9.0 STRL STRW (GLOVE) ×2 IMPLANT
GLOVE SURG SS PI 7.0 STRL IVOR (GLOVE) ×2 IMPLANT
GLOVE SURG SS PI 7.5 STRL IVOR (GLOVE) ×2 IMPLANT
GOWN PREVENTION PLUS XLARGE (GOWN DISPOSABLE) ×6 IMPLANT
GOWN STRL NON-REIN LRG LVL3 (GOWN DISPOSABLE) ×2 IMPLANT
GOWN STRL REIN XL XLG (GOWN DISPOSABLE) ×8 IMPLANT
HANDPIECE INTERPULSE COAX TIP (DISPOSABLE) ×1
IMMOBILIZER KNEE 20 (SOFTGOODS) ×2
IMMOBILIZER KNEE 20 THIGH 36 (SOFTGOODS) ×1 IMPLANT
KIT BASIN OR (CUSTOM PROCEDURE TRAY) ×2 IMPLANT
NS IRRIG 1000ML POUR BTL (IV SOLUTION) ×2 IMPLANT
PACK TOTAL JOINT (CUSTOM PROCEDURE TRAY) ×2 IMPLANT
PATELLA DOME PFC 38MM (Knees) IMPLANT
POSITIONER SURGICAL ARM (MISCELLANEOUS) ×2 IMPLANT
SET HNDPC FAN SPRY TIP SCT (DISPOSABLE) ×1 IMPLANT
SET PAD KNEE POSITIONER (MISCELLANEOUS) ×2 IMPLANT
SPONGE GAUZE 4X4 12PLY (GAUZE/BANDAGES/DRESSINGS) ×2 IMPLANT
SPONGE LAP 18X18 X RAY DECT (DISPOSABLE) IMPLANT
SPONGE SURGIFOAM ABS GEL 100 (HEMOSTASIS) ×2 IMPLANT
STOCKINETTE 6  STRL (DRAPES) ×1
STOCKINETTE 6 STRL (DRAPES) ×1 IMPLANT
STRIP CLOSURE SKIN 1/2X4 (GAUZE/BANDAGES/DRESSINGS) ×2 IMPLANT
SUCTION FRAZIER 12FR DISP (SUCTIONS) ×2 IMPLANT
SUT BONE WAX W31G (SUTURE) ×2 IMPLANT
SUT MNCRL AB 3-0 PS2 18 (SUTURE) ×2 IMPLANT
SUT VIC AB 0 CT1 27 (SUTURE) ×2
SUT VIC AB 0 CT1 27XBRD ANTBC (SUTURE) ×2 IMPLANT
SUT VIC AB 1 CT1 27 (SUTURE) ×7
SUT VIC AB 1 CT1 27XBRD ANTBC (SUTURE) ×7 IMPLANT
SUT VIC AB 2-0 CT1 27 (SUTURE) ×2
SUT VIC AB 2-0 CT1 TAPERPNT 27 (SUTURE) ×2 IMPLANT
TAPE STRIPS DRAPE STRL (GAUZE/BANDAGES/DRESSINGS) ×2 IMPLANT
TOWEL OR 17X26 10 PK STRL BLUE (TOWEL DISPOSABLE) ×6 IMPLANT
TOWER CARTRIDGE SMART MIX (DISPOSABLE) ×2 IMPLANT
TRAY FOLEY CATH 14FRSI W/METER (CATHETERS) ×2 IMPLANT
WATER STERILE IRR 1500ML POUR (IV SOLUTION) ×2 IMPLANT
WRAP KNEE MAXI GEL POST OP (GAUZE/BANDAGES/DRESSINGS) IMPLANT

## 2011-12-12 NOTE — Anesthesia Procedure Notes (Signed)
Spinal  Patient location during procedure: OR Staffing Performed by: anesthesiologist  Preanesthetic Checklist Completed: patient identified, site marked, surgical consent, pre-op evaluation, timeout performed, IV checked, risks and benefits discussed and monitors and equipment checked Spinal Block Patient position: sitting Prep: Betadine Patient monitoring: heart rate, continuous pulse ox and blood pressure Location: L2-3 Injection technique: single-shot Needle Needle type: Spinocan and Sprotte  Needle gauge: 24 G Needle length: 9 cm Additional Notes Expiration date of kit checked and confirmed. Patient tolerated procedure well, without complications. No heme. No paresthesia.

## 2011-12-12 NOTE — H&P (Signed)
NAME:  [name], [name] NO.:  [id number]  MEDICAL RECORD NO.:  [id number]  LOCATION:                               FACILITY:  [facility]  PHYSICIAN:  Erasmo Leventhal, M.D.DATE OF BIRTH:  04-Nov-1946  DATE OF ADMISSION:  12/12/2011 DATE OF DISCHARGE:                             HISTORY & PHYSICAL   CHIEF COMPLAINT:  Painful loss of range of motion to his right knee.  HISTORY OF PRESENT ILLNESS:  The patient is a 66 year old gentleman, well-known to Dr. Thomasena Edis for evaluation and treatment of his knee pain. The patient had a total knee arthroplasty on his left foot 9 years previous.  He has done real well.  The patient continued to have pain, difficulty with weight bearing, range of motion in his right knee.  He has failed conservative treatment.  The patient has done so well with his left knee.  He would like to proceed with a total knee arthroplasty on his right.  X-rays reveal he has significant arthritic change in the medial compartment with varus deformity and degenerative changes under his patella facet.  He has been cleared by his primary care physician, Dr. Rodrigo Ran.  He has also recently had a cardiac stress test by Dr. Donnie Aho.  PRIMARY CARE PHYSICIAN:  Mark A. Perini, MD  CARDIOLOGY:  Recent evaluation by Georga Hacking, MD  ALLERGIES:  None.  CURRENT MEDICATIONS: 1. Crestor 20 mg once a day. 2. Ziac 25 mg once a day. 3. __________ 10 mg once a day. 4. Lotrel 5/20 mg once a day. 5. Gabapentin 100 mg 3 times a day. 6. Fish oil 3 tablets a day. 7. Osteo Bi-Flex once a day. 8. Aspirin 81 mg a day on hold. 9. Vitamin B12 1000 mg a day. 10.Vitamin D3 1000 mg a day. 11.Cardura 4 mg tablets one to one-half tablet p.r.n. increased blood     pressure, does not use frequently.  PAST MEDICAL HISTORY: 1. Hypertension. 2. Sleep apnea with nasal cannula at 2 L, use nightly.  REVIEW OF SYSTEMS:  NEUROLOGIC:  He denies any strokes,  seizures, convulsions, numbness or tingling, impaired vision, or memory.  No alcohol or drug abuse in the past.  No anxiety or depression issues. PULMONARY:  He does have sleep apnea.  He just uses nasal cannula at night 2 L/minute.  He denies any problems with wheezing, productive cough, shortness of breath, or history of tuberculosis.  CARDIOVASCULAR: He denies any chest pains, irregular heart rhythms.  No shortness of breath with activity.  No PND.  No previous heart surgery or catheterizations.  He did have a stress test 3 weeks ago for this upcoming surgery which was unremarkable by Dr. Donnie Aho.  GI:  Denies any acid reflux, heartburn, indigestion, constipation, diarrhea.  No problems with his liver, gallbladder, or colon.  GU:  He denies any problems with burning on urination.  No prostate issues.  No kidney stones.  No kidney failure.  ENDOCRINE:  He denies any problems related to his thyroid or diabetes.  PAST SURGICAL HISTORY:  Left knee replacement 9 years previous without any complications.  He denies any issues with anesthesia.  FAMILY MEDICAL HISTORY:  Father  is deceased from a stroke at age of 50. Mother is deceased from heart disease at 20.  SOCIAL HISTORY:  He is married.  He has smoked in the past.  He has 3-4 alcoholic beverages daily.  He denies any issues related to withdrawal. He has got 3 grown children.  He will be returning home for postoperative care.  PHYSICAL EXAMINATION:  VITAL SIGNS:  Height is 5 feet 8 inches, weight is 255 pounds, blood pressure is 128/78, pulse of 70 and regular, respirations 12 nonlabored.  The patient is afebrile. GENERAL:  Short stature, heavy.  The patient is conscious, alert and appropriate, healthy-appearing gentleman, walks with a slight right- sided limp. HEENT:  Head is normocephalic.  Pupils equal.  Gross hearing is intact. NECK:  Supple.  Good range of motion.  No palpable lymphadenopathy. HEART:  Regular rate and rhythm.   No murmurs. LUNGS:  Clear throughout. ABDOMEN:  Round, soft, bowel sounds present. EXTREMITIES:  Upper extremities had good range of motion with good motor strength.  Lower extremities, he had good range of motion both hips. His right knee, he had painful full extension, he was able to flex it back to 120.  He had pain along the medial joint line.  No pain laterally.  He had no ligament instability.  Calf was soft and nontender.  His left knee had full extension, flexion to 120.  No ligament instability.  Calf was soft.  Good motion of both ankles. PERIPHERAL VASCULAR:  Carotid pulses were 2+.  No bruits.  Radial pulses were 2+.  Posterior tibial pulses were 1+.  He had no lower extremity edema noted. BREAST/RECTAL/GU:  Deferred at this time.  IMPRESSION: 1. Advanced osteoarthritis, right knee. 2. Hypertension. 3. Sleep apnea.  PLAN:  The patient will undergo all routine labs and tests prior to having a right total knee arthroplasty by Dr. Thomasena Edis on December 12, 2011.     Jamelle Rushing, P.A.   ______________________________ Erasmo Leventhal, M.D.    RWK/MEDQ  D:  12/10/2011  T:  12/10/2011  Job:  [id number] I have seen and examined this patient.  Agree with the note above.  [name] 12/12/2011 1:11 PM

## 2011-12-12 NOTE — Anesthesia Postprocedure Evaluation (Signed)
  Anesthesia Post-op Note  Patient: [name]  Procedure(s) Performed:  TOTAL KNEE ARTHROPLASTY  Patient Location: PACU  Anesthesia Type: Spinal  Level of Consciousness: awake and alert   Airway and Oxygen Therapy: Patient Spontanous Breathing  Post-op Pain: mild  Post-op Assessment: Post-op Vital signs reviewed, Patient's Cardiovascular Status Stable, Respiratory Function Stable, Patent Airway and No signs of Nausea or vomiting  Post-op Vital Signs: stable  Complications: No apparent anesthesia complications. Moving both feet.

## 2011-12-12 NOTE — H&P (Signed)
NAME:  [name], HYLE NO.:  [id number]  MEDICAL RECORD NO.:  [id number]  LOCATION:                               FACILITY:  [facility]  PHYSICIAN:  Erasmo Leventhal, M.D.DATE OF BIRTH:  10-Jun-1946  DATE OF ADMISSION:  12/12/2011 DATE OF DISCHARGE:                             HISTORY & PHYSICAL   CHIEF COMPLAINT:  Painful loss of range of motion to his right knee.  HISTORY OF PRESENT ILLNESS:  The patient is a 66 year old gentleman, well-known to Dr. Thomasena Edis for evaluation and treatment of his knee pain. The patient had a total knee arthroplasty on his left foot 9 years previous.  He has done real well.  The patient continued to have pain, difficulty with weight bearing, range of motion in his right knee.  He has failed conservative treatment.  The patient has done so well with his left knee.  He would like to proceed with a total knee arthroplasty on his right.  X-rays reveal he has significant arthritic change in the medial compartment with varus deformity and degenerative changes under his patella facet.  He has been cleared by his primary care physician, Dr. Rodrigo Ran.  He has also recently had a cardiac stress test by Dr. Donnie Aho.  PRIMARY CARE PHYSICIAN:  Mark A. Perini, MD  CARDIOLOGY:  Recent evaluation by Georga Hacking, MD  ALLERGIES:  None.  CURRENT MEDICATIONS: 1. Crestor 20 mg once a day. 2. Ziac 25 mg once a day. 3. __________ 10 mg once a day. 4. Lotrel 5/20 mg once a day. 5. Gabapentin 100 mg 3 times a day. 6. Fish oil 3 tablets a day. 7. Osteo Bi-Flex once a day. 8. Aspirin 81 mg a day on hold. 9. Vitamin B12 1000 mg a day. 10.Vitamin D3 1000 mg a day. 11.Cardura 4 mg tablets one to one-half tablet p.r.n. increased blood     pressure, does not use frequently.  PAST MEDICAL HISTORY: 1. Hypertension. 2. Sleep apnea with nasal cannula at 2 L, use nightly.  REVIEW OF SYSTEMS:  NEUROLOGIC:  He denies any strokes,  seizures, convulsions, numbness or tingling, impaired vision, or memory.  No alcohol or drug abuse in the past.  No anxiety or depression issues. PULMONARY:  He does have sleep apnea.  He just uses nasal cannula at night 2 L/minute.  He denies any problems with wheezing, productive cough, shortness of breath, or history of tuberculosis.  CARDIOVASCULAR: He denies any chest pains, irregular heart rhythms.  No shortness of breath with activity.  No PND.  No previous heart surgery or catheterizations.  He did have a stress test 3 weeks ago for this upcoming surgery which was unremarkable by Dr. Donnie Aho.  GI:  Denies any acid reflux, heartburn, indigestion, constipation, diarrhea.  No problems with his liver, gallbladder, or colon.  GU:  He denies any problems with burning on urination.  No prostate issues.  No kidney stones.  No kidney failure.  ENDOCRINE:  He denies any problems related to his thyroid or diabetes.  PAST SURGICAL HISTORY:  Left knee replacement 9 years previous without any complications.  He denies any issues with anesthesia.  FAMILY MEDICAL HISTORY:  Father  is deceased from a stroke at age of 69. Mother is deceased from heart disease at 63.  SOCIAL HISTORY:  He is married.  He has smoked in the past.  He has 3-4 alcoholic beverages daily.  He denies any issues related to withdrawal. He has got 3 grown children.  He will be returning home for postoperative care.  PHYSICAL EXAMINATION:  VITAL SIGNS:  Height is 5 feet 8 inches, weight is 255 pounds, blood pressure is 128/78, pulse of 70 and regular, respirations 12 nonlabored.  The patient is afebrile. GENERAL:  Short stature, heavy.  The patient is conscious, alert and appropriate, healthy-appearing gentleman, walks with a slight right- sided limp. HEENT:  Head is normocephalic.  Pupils equal.  Gross hearing is intact. NECK:  Supple.  Good range of motion.  No palpable lymphadenopathy. HEART:  Regular rate and rhythm.   No murmurs. LUNGS:  Clear throughout. ABDOMEN:  Round, soft, bowel sounds present. EXTREMITIES:  Upper extremities had good range of motion with good motor strength.  Lower extremities, he had good range of motion both hips. His right knee, he had painful full extension, he was able to flex it back to 120.  He had pain along the medial joint line.  No pain laterally.  He had no ligament instability.  Calf was soft and nontender.  His left knee had full extension, flexion to 120.  No ligament instability.  Calf was soft.  Good motion of both ankles. PERIPHERAL VASCULAR:  Carotid pulses were 2+.  No bruits.  Radial pulses were 2+.  Posterior tibial pulses were 1+.  He had no lower extremity edema noted. BREAST/RECTAL/GU:  Deferred at this time.  IMPRESSION: 1. Advanced osteoarthritis, right knee. 2. Hypertension. 3. Sleep apnea.  PLAN:  The patient will undergo all routine labs and tests prior to having a right total knee arthroplasty by Dr. Thomasena Edis on December 12, 2011.     Jamelle Rushing, P.A.   ______________________________ Erasmo Leventhal, M.D.    RWK/MEDQ  D:  12/10/2011  T:  12/10/2011  Job:  [id number] I have seen and examined this patient.  Agree with the note above.  Ema Hebner ANDREW 12/12/2011 1:11 PM  I have seen and examined this patient.  Agree with the note above.  Mikyla Schachter ANDREW 12/12/2011 1:12 PM  I have seen and examined this patient.  Agree with the note above.  Ronneisha Jett ANDREW 12/12/2011 1:12 PM

## 2011-12-12 NOTE — Transfer of Care (Signed)
Immediate Anesthesia Transfer of Care Note  Patient: [name]  Procedure(s) Performed:  TOTAL KNEE ARTHROPLASTY  Patient Location: PACU  Anesthesia Type: Spinal  Level of Consciousness: awake, alert  and oriented  Airway & Oxygen Therapy: Patient Spontanous Breathing and Patient connected to face mask oxygen  Post-op Assessment: Report given to PACU RN and Post -op Vital signs reviewed and stable  Post vital signs: Reviewed and stable  Complications: No apparent anesthesia complications

## 2011-12-12 NOTE — Op Note (Signed)
DATE OF SURGERY:  12/12/2011  TIME: 3:18 PM  PATIENT NAME:  [name]    AGE: 66 y.o.   PRE-OPERATIVE DIAGNOSIS:  Right Knee Osteoarthritis  POST-OPERATIVE DIAGNOSIS:  right knee osteoarthritis   PROCEDURE:  Procedure(s): TOTAL KNEE ARTHROPLASTY  SURGEON:  Sakira Dahmer ANDREW  ASSISTANT:  Oneida Alar, PA-C, present and scrubbed throughout the case, critical for assistance with exposure, retraction, instrumentation, and closure.  OPERATIVE IMPLANTS: Depuy PFC Sigma Rotating Platform.  Femur size 5, Tibia size 5, Patella size 38  3-peg oval button, with a 10 mm polyethylene insert.   PREOPERATIVE INDICATIONS:   [name] is a 66 y.o. year old male with end stage bone on bone arthritis of the knee who failed conservative treatment and elected for Total Knee Arthroplasty.   The risks, benefits, and alternatives were discussed at length including but not limited to the risks of infection, bleeding, nerve injury, stiffness, blood clots, the need for revision surgery, cardiopulmonary complications, among others, and they were willing to proceed.  OPERATIVE DESCRIPTION:  The patient was brought to the operative room and placed in a supine position.  Spinal anesthesia was administered.  IV antibiotics were given.  The lower extremity was prepped and draped in the usual sterile fashion.  Time out was performed.  The leg was elevated and exsanguinated and the tourniquet was inflated.  Anterior quadriceps tendon splitting approach was performed.  The patella was retracted and osteophytes were removed.  The anterior horn of the medial and lateral meniscus was removed and cruciate ligaments resected.   The distal femur was opened with the drill and the intramedullary distal femoral cutting jig was utilized, set at 5 degrees resecting 10 mm off the distal femur.  Care was taken to protect the collateral ligaments.  The distal femoral sizing jig was applied, taking care to avoid  notching.  Then the 4-in-1 cutting jig was applied and the anterior and posterior femur was cut, along with the chamfer cuts.    Then the extramedullary tibial cutting jig was utilized making the appropriate cut using the anterior tibial crest as a reference building in appropriate posterior slope.  Care was taken during the cut to protect the medial and collateral ligaments.  The proximal tibia was removed along with the posterior horns of the menisci.   The posterior medial femoral osteophytes and posterior lateral femoral osteophytes were removed.    The flexion gap was then measured and was symmetric with the extension gap, measured at 10.  I completed the distal femoral preparation using the appropriate jig to prepare the box.  The patella was then measured, and cut with the saw.    The proximal tibia sized and prepared accordingly with the reamer and the punch, and then all components were trialed with the trial insert.  The knee was found to have excellent balance and full motion.    The above named components were then cemented into place and all excess cement was removed.  The trial polyethylene component was in place during cementation, and then was exchanged for the real polyethylene component.    The knee was easily taken through a range of motion and the patella tracked well and the knee irrigated copiously and the parapatellar and subcutaneous tissue closed with vicryl, and monocryl with steri strips for the skin.  The arthrotomy was closed at 90 of flexion. The wounds were dressed with sterile gauze and the tourniquet released and the patient was awakened and returned to the  PACU in stable and satisfactory condition.  There were no complications.  Total tourniquet time was 85 minutes.

## 2011-12-12 NOTE — Preoperative (Signed)
Beta Blockers   Reason not to administer Beta Blockers:Not Applicable 

## 2011-12-12 NOTE — Anesthesia Preprocedure Evaluation (Addendum)
Anesthesia Evaluation  Patient identified by MRN, date of birth, ID band Patient awake    Reviewed: Allergy & Precautions, H&P , NPO status , Patient's Chart, lab work & pertinent test results  Airway Mallampati: II TM Distance: >3 FB Neck ROM: Full    Dental No notable dental hx.    Pulmonary sleep apnea ,  clear to auscultation  Pulmonary exam normal       Cardiovascular hypertension, Pt. on medications and Pt. on home beta blockers + Valvular Problems/Murmurs Regular Normal    Neuro/Psych LE neuropathy. Now improved with gabapentin Negative Psych ROS   GI/Hepatic Neg liver ROS, GERD-  Medicated,  Endo/Other  Negative Endocrine ROS  Renal/GU negative Renal ROS  Genitourinary negative   Musculoskeletal negative musculoskeletal ROS (+)   Abdominal (+) obese,   Peds negative pediatric ROS (+)  Hematology negative hematology ROS (+)   Anesthesia Other Findings   Reproductive/Obstetrics negative OB ROS                          Anesthesia Physical Anesthesia Plan  ASA: III  Anesthesia Plan: Spinal   Post-op Pain Management:    Induction:   Airway Management Planned:   Additional Equipment:   Intra-op Plan:   Post-operative Plan:   Informed Consent: I have reviewed the patients History and Physical, chart, labs and discussed the procedure including the risks, benefits and alternatives for the proposed anesthesia with the patient or authorized representative who has indicated his/her understanding and acceptance.     Plan Discussed with: CRNA  Anesthesia Plan Comments: (Discussed risks/benefits of spinal including headache, backache, failure, bleeding, infection, and nerve damage. Patient consents to spinal. Questions answered. Coagulation studies and platelet count acceptable. )       Anesthesia Quick Evaluation

## 2011-12-13 LAB — CBC
HCT: 33.2 % — ABNORMAL LOW (ref 39.0–52.0)
MCH: 31.2 pg (ref 26.0–34.0)
MCV: 91.7 fL (ref 78.0–100.0)
RDW: 12.9 % (ref 11.5–15.5)
WBC: 11.3 10*3/uL — ABNORMAL HIGH (ref 4.0–10.5)

## 2011-12-13 LAB — BASIC METABOLIC PANEL
BUN: 11 mg/dL (ref 6–23)
Calcium: 9 mg/dL (ref 8.4–10.5)
Chloride: 99 mEq/L (ref 96–112)
Creatinine, Ser: 0.67 mg/dL (ref 0.50–1.35)
GFR calc Af Amer: >90 mL/min (ref >90)

## 2011-12-13 NOTE — Progress Notes (Signed)
Physical Therapy Treatment Patient Details Name: [name] MRN: [id number] DOB: 1945-12-12 Today's Date: 12/13/2011  PT Assessment/Plan  PT - Assessment/Plan Comments on Treatment Session: Pt progressing, although increased soreness this afternoon following CPM. Will continue per plan PT Plan: Discharge plan remains appropriate;Frequency remains appropriate PT Frequency: 7X/week Follow Up Recommendations: Home health PT;Supervision - Intermittent Equipment Recommended: Rolling walker with 5" wheels PT Goals  Acute Rehab PT Goals PT Goal Formulation: With patient PT Goal: Supine/Side to Sit - Progress: Progressing toward goal PT Goal: Sit to Supine/Side - Progress: Progressing toward goal PT Goal: Sit to Stand - Progress: Progressing toward goal PT Goal: Stand to Sit - Progress: Progressing toward goal PT Transfer Goal: Bed to Chair/Chair to Bed - Progress: Progressing toward goal PT Goal: Ambulate - Progress: Progressing toward goal PT Goal: Up/Down Stairs - Progress: Goal set today PT Goal: Perform Home Exercise Program - Progress: Progressing toward goal  PT Treatment Precautions/Restrictions  Precautions Required Braces or Orthoses: Yes Knee Immobilizer: On except when in CPM Restrictions Weight Bearing Restrictions: Yes RLE Weight Bearing: Weight bearing as tolerated Mobility (including Balance) Bed Mobility Bed Mobility: Yes Supine to Sit: 5: Supervision;With rails Supine to Sit Details (indicate cue type and reason): VC for sequencing. Pt able to complete without physical assist Sitting - Scoot to Edge of Bed: 5: Supervision Sitting - Scoot to Edge of Bed Details (indicate cue type and reason): VC for ease of scooting and weight shifting Transfers Transfers: Yes Sit to Stand: 5: Supervision;With upper extremity assist;From bed Sit to Stand Details (indicate cue type and reason): VC for hand placement and safety with UE placement Stand to Sit: 5:  Supervision;With upper extremity assist;To chair/3-in-1 Stand to Sit Details: VC for hand placement. Pt able to control descent into chair Ambulation/Gait Ambulation/Gait: Yes Ambulation/Gait Assistance: 4: Min assist (Minguard assist) Ambulation/Gait Assistance Details (indicate cue type and reason): VC for RW safety, increased heel strike and even step length. Ambulation Distance (Feet): 80 Feet Assistive device: Rolling walker Gait Pattern: Step-to pattern;Decreased step length - left;Decreased stance time - right;Decreased stride length;Decreased hip/knee flexion - right;Decreased weight shift to right;Trunk flexed Gait velocity: Decreased gait speed Stairs: No    Exercise    End of Session PT - End of Session Equipment Utilized During Treatment: Gait belt;Right knee immobilizer Activity Tolerance: Patient tolerated treatment well Patient left: in chair;with call bell in reach Nurse Communication: Mobility status for transfers;Mobility status for ambulation General Behavior During Session: [facility] for tasks performed Cognition: [facility] for tasks performed  Milana Kidney 12/13/2011, 3:40 PM  12/13/2011 Milana Kidney DPT PAGER: [phone] OFFICE: [phone]

## 2011-12-13 NOTE — Progress Notes (Signed)
Subjective: 1 Day Post-Op Procedure(s) (LRB): TOTAL KNEE ARTHROPLASTY (Right) Patient reports pain as 7 on 0-10 scale  Pain appropriate    No other complaints Objective: Vital signs in last 24 hours: Temp:  [97 F (36.1 C)-97.9 F (36.6 C)] 97.4 F (36.3 C) (01/26 0620) Pulse Rate:  [42-70] 64  (01/26 0620) Resp:  [14-18] 18  (01/26 0620) BP: (112-138)/(68-84) 138/81 mmHg (01/26 0620) SpO2:  [90 %-100 %] 94 % (01/26 0620) Weight:  [102.059 kg (225 lb)] 102.059 kg (225 lb) (01/25 1751)  Intake/Output from previous day: 01/25 0701 - 01/26 0700 In: 2913.8 [P.O.:240; I.V.:2418.8; IV Piggyback:255] Out: 1415 [Urine:1325; Drains:90] Intake/Output this shift:     Basename 12/13/11 0520 12/10/11 1445  HGB 11.3* 14.4    Basename 12/13/11 0520 12/10/11 1445  WBC 11.3* 6.5  RBC 3.62* 4.42  HCT 33.2* 40.9  PLT 166 206    Basename 12/13/11 0520 12/10/11 1445  NA 133* 139  K 3.9 4.4  CL 99 101  CO2 25 28  BUN 11 16  CREATININE 0.67 0.81  GLUCOSE 146* 99  CALCIUM 9.0 10.1    Basename 12/10/11 1445  LABPT --  INR 0.97  pulses intact dresiing dry  Drain pulled  Moves foot well without pain  Neurovascular intact  Assessment/Plan: 1 Day Post-Op Procedure(s) (LRB): TOTAL KNEE ARTHROPLASTY (Right) hyponatremia Up with therapy  Change fliuds TKR protocol [name] ANDREW 12/13/2011, 7:19 AM

## 2011-12-13 NOTE — Progress Notes (Signed)
Physical Therapy Evaluation Patient Details Name: [name] MRN: [id number] DOB: 1946/02/13 Today's Date: 12/13/2011  Problem List: There is no problem list on file for this patient.   Past Medical History:  Past Medical History  Diagnosis Date  . Hypertension   . Heart murmur     pt states he " outgrew"   . GERD (gastroesophageal reflux disease)   . Arthritis     knees, right foot   . Sleep apnea     does not use machine , uses 02 2l/[place] at nite    Past Surgical History:  Past Surgical History  Procedure Date  . Joint replacement     left knee replacement   . Other surgical history     anal fissure reconstruction     PT Assessment/Plan/Recommendation PT Assessment Clinical Impression Statement: Pt presents with a medical diagnosis of Right TKA along with the following impairments/deficits and therapy diagnosis listed below. Pt will benefit from skilled PT in the acute care setting in order to maximize functional mobility for a safe d/c hom PT Recommendation/Assessment: Patient will need skilled PT in the acute care venue PT Problem List: Decreased strength;Decreased range of motion;Decreased activity tolerance;Decreased mobility;Decreased knowledge of use of DME;Decreased knowledge of precautions;Pain PT Therapy Diagnosis : Difficulty walking;Acute pain PT Plan PT Frequency: 7X/week PT Treatment/Interventions: DME instruction;Gait training;Stair training;Functional mobility training;Therapeutic activities;Therapeutic exercise;Patient/family education PT Recommendation Follow Up Recommendations: Home health PT;Supervision - Intermittent Equipment Recommended: Rolling walker with 5" wheels PT Goals  Acute Rehab PT Goals PT Goal Formulation: With patient Time For Goal Achievement: 7 days Pt will go Supine/Side to Sit: with modified independence PT Goal: Supine/Side to Sit - Progress: Goal set today Pt will go Sit to Supine/Side: with modified independence PT Goal:  Sit to Supine/Side - Progress: Goal set today Pt will go Sit to Stand: with modified independence PT Goal: Sit to Stand - Progress: Goal set today Pt will go Stand to Sit: with modified independence PT Goal: Stand to Sit - Progress: Goal set today Pt will Transfer Bed to Chair/Chair to Bed: with modified independence PT Transfer Goal: Bed to Chair/Chair to Bed - Progress: Goal set today Pt will Ambulate: >150 feet;with modified independence;with rolling walker PT Goal: Ambulate - Progress: Goal set today Pt will Go Up / Down Stairs: 3-5 stairs;with rail(s);with min assist;with least restrictive assistive device PT Goal: Up/Down Stairs - Progress: Goal set today Pt will Perform Home Exercise Program: Independently PT Goal: Perform Home Exercise Program - Progress: Goal set today  PT Evaluation Precautions/Restrictions  Precautions Required Braces or Orthoses: Yes Knee Immobilizer: On except when in CPM Restrictions Weight Bearing Restrictions: Yes RLE Weight Bearing: Weight bearing as tolerated Prior Functioning  Home Living Lives With: Spouse Receives Help From: Family Type of Home: House Home Layout: Two level;Able to live on main level with bedroom/bathroom (no shower on the main floor) Alternate Level Stairs-Rails: Left Alternate Level Stairs-Number of Steps: 12 Home Access: Stairs to enter Entrance Stairs-Rails: Left Entrance Stairs-Number of Steps: 5 Bathroom Shower/Tub: Health visitor: Standard Bathroom Accessibility: Yes How Accessible: Accessible via walker Home Adaptive Equipment: Built-in shower seat;Straight cane Prior Function Level of Independence: Independent with basic ADLs;Independent with homemaking with ambulation;Independent with gait;Independent with transfers Able to Take Stairs?: Yes Driving: Yes Vocation: Retired Financial risk analyst Arousal/Alertness: Awake/alert Overall Cognitive Status: Appears within functional limits for tasks  assessed Orientation Level: Oriented X4 Sensation/Coordination Sensation Light Touch: Appears Intact Extremity Assessment RLE Assessment RLE Assessment: Exceptions  to [facility] RLE AROM (degrees) Overall AROM Right Lower Extremity: Deficits;Due to pain;Due to decreased strength RLE Overall AROM Comments: Hip and Ankle WFL; Knee 0-65 RLE Strength RLE Overall Strength: Deficits;Due to pain RLE Overall Strength Comments: Hip and Ankle WFL; Pt able to complete SLR with min assist LLE Assessment LLE Assessment: Within Functional Limits Mobility (including Balance) Bed Mobility Bed Mobility: Yes Supine to Sit: 4: Min assist;HOB elevated (Comment degrees);With rails (30) Supine to Sit Details (indicate cue type and reason): VC for hand placement and sequencing. Min assist with RLE into sitting Sitting - Scoot to Edge of Bed: 5: Supervision Sitting - Scoot to [place] of Bed Details (indicate cue type and reason): VC for hand placement and weight shifting Transfers Transfers: Yes Sit to Stand: 4: Min assist;With upper extremity assist;From bed Sit to Stand Details (indicate cue type and reason): VC for hand placement and safety to RW. Min assist with forward translation into standing Stand to Sit: 5: Supervision;With upper extremity assist;To chair/3-in-1 Stand to Sit Details: VC for hand placement for safety from RW Ambulation/Gait Ambulation/Gait: Yes Ambulation/Gait Assistance: 4: Min assist (Minguard assist) Ambulation/Gait Assistance Details (indicate cue type and reason): Postural cues throughout ambulation as well as distance to RW safety. VC for proper gait sequencing and for even step length. Ambulation Distance (Feet): 80 Feet Assistive device: Rolling walker Gait Pattern: Step-to pattern;Decreased step length - left;Decreased stance time - right;Decreased stride length;Decreased hip/knee flexion - right;Decreased weight shift to right;Trunk flexed Gait velocity: Decreased gait  speed Stairs: No    Exercise  Total Joint Exercises Ankle Circles/Pumps: AROM;Both;10 reps;Supine Quad Sets: AROM;Strengthening;Right;10 reps;Supine Straight Leg Raises: AAROM;Strengthening;Right;10 reps;Supine End of Session PT - End of Session Equipment Utilized During Treatment: Gait belt;Right knee immobilizer Activity Tolerance: Patient tolerated treatment well Patient left: in chair;with call bell in reach Nurse Communication: Mobility status for transfers;Mobility status for ambulation General Behavior During Session: [facility] for tasks performed Cognition: [facility] for tasks performed  Milana Kidney 12/13/2011, 9:48 AM  12/13/2011 Milana Kidney DPT PAGER: [phone] OFFICE: [phone]

## 2011-12-14 LAB — BASIC METABOLIC PANEL
CO2: 25 mEq/L (ref 19–32)
Calcium: 9.3 mg/dL (ref 8.4–10.5)
Chloride: 99 mEq/L (ref 96–112)
GFR calc Af Amer: >90 mL/min (ref >90)
Sodium: 134 mEq/L — ABNORMAL LOW (ref 135–145)

## 2011-12-14 LAB — CBC
Platelets: 164 10*3/uL (ref 150–400)
RBC: 3.73 MIL/uL — ABNORMAL LOW (ref 4.22–5.81)
RDW: 13.4 % (ref 11.5–15.5)
WBC: 9.7 10*3/uL (ref 4.0–10.5)

## 2011-12-14 NOTE — Evaluation (Signed)
Occupational Therapy Evaluation Patient Details Name: [name] MRN: [id number] DOB: Apr 27, 1946 Today's Date: 12/14/2011 [id number] Problem List: There is no problem list on file for this patient.   Past Medical History:  Past Medical History  Diagnosis Date  . Hypertension   . Heart murmur     pt states he " outgrew"   . GERD (gastroesophageal reflux disease)   . Arthritis     knees, right foot   . Sleep apnea     does not use machine , uses 02 2l/[place] at nite    Past Surgical History:  Past Surgical History  Procedure Date  . Joint replacement     left knee replacement   . Other surgical history     anal fissure reconstruction     OT Assessment/Plan/Recommendation OT Assessment Clinical Impression Statement: This 66 year old man was admitted for L TKA.  He had R done 9 years ago.  All education was completed, no further OT is needed, and no bathroom DME needs. OT Goals    OT Evaluation Precautions/Restrictions  Precautions Required Braces or Orthoses: Yes Knee Immobilizer: On except when in CPM Restrictions Weight Bearing Restrictions: Yes RLE Weight Bearing: Weight bearing as tolerated Prior Functioning Home Living Bathroom Shower/Tub:  (shower is upstairs; will sponge bathe initially) Prior Function Level of Independence: Independent with basic ADLs;Independent with homemaking with ambulation;Independent with gait;Independent with transfers ADL ADL Grooming: Simulated;Supervision/safety Where Assessed - Grooming: Standing at sink Upper Body Bathing: Simulated;Set up Where Assessed - Upper Body Bathing: Sitting, chair;Supported Lower Body Bathing: Simulated;Minimal assistance Where Assessed - Lower Body Bathing: Sit to stand from chair Upper Body Dressing: Simulated;Set up Where Assessed - Upper Body Dressing: Sitting, chair;Supported Lower Body Dressing: Simulated;Minimal assistance Where Assessed - Lower Body Dressing: Sit to stand from  chair Toilet Transfer: Performed;Minimal assistance (min guard) Toilet Transfer Method: Proofreader: Comfort height toilet;Other (comment) (has standard at home; should be OK with sink next to it) Toileting - Clothing Manipulation: Simulated;Supervision/safety Where Assessed - Glass blower/designer Manipulation: Standing Toileting - Hygiene: Simulated;Supervision/safety Where Assessed - Toileting Hygiene: Standing Tub/Shower Transfer: Performed;Minimal assistance (min guard) Tub/Shower Transfer Method: Science writer: Walk in shower Equipment Used: Rolling walker Ambulation Related to ADLs: min guard:  initially moved quickly  ADL Comments: wife will assist as needed Vision/Perception  Vision - History Patient Visual Report: No change from baseline Cognition Cognition Overall Cognitive Status: Appears within functional limits for tasks assessed Orientation Level: Oriented X4 Sensation/Coordination   Extremity Assessment   Mobility  Bed Mobility Supine to Sit: 5: Supervision;With rails Transfers Sit to Stand: 5: Supervision;With upper extremity assist;From bed Sit to Stand Details (indicate cue type and reason): min cues for safety/speed Exercises   End of Session OT - End of Session Activity Tolerance: Patient tolerated treatment well Patient left: in chair;with call bell in reach General Behavior During Session: [facility] for tasks performed Cognition: [facility] for tasks performed [facility], OTR/L [phone] 12/14/2011  Raylea Adcox 12/14/2011, 9:13 AM

## 2011-12-14 NOTE — Progress Notes (Signed)
Subjective: Dressing Changed and wound is fine. DC Plans for Monday.   Objective: Vital signs in last 24 hours: Temp:  [97.5 F (36.4 C)-98.4 F (36.9 C)] 98.4 F (36.9 C) (01/27 0430) Pulse Rate:  [60-76] 76  (01/27 0430) Resp:  [16-20] 16  (01/27 0430) BP: (130-160)/(73-88) 160/88 mmHg (01/27 0430) SpO2:  [92 %-95 %] 92 % (01/27 0430)  Intake/Output from previous day: 01/26 0701 - 01/27 0700 In: 2155 [P.O.:120; I.V.:2035] Out: 600 [Urine:600] Intake/Output this shift:     Basename 12/14/11 0520 12/13/11 0520  HGB 11.6* 11.3*    Basename 12/14/11 0520 12/13/11 0520  WBC 9.7 11.3*  RBC 3.73* 3.62*  HCT 34.7* 33.2*  PLT 164 166    Basename 12/14/11 0520 12/13/11 0520  NA 134* 133*  K 3.7 3.9  CL 99 99  CO2 25 25  BUN 9 11  CREATININE 0.85 0.67  GLUCOSE 138* 146*  CALCIUM 9.3 9.0   No results found for this basename: LABPT:2,INR:2 in the last 72 hours  Neurovascular intact No cellulitis present  Assessment/Plan: DC Plans for Monday.   Joey Hudock A 12/14/2011, 8:15 AM

## 2011-12-14 NOTE — Progress Notes (Signed)
Cm spoke with pt concerning d/c planning. Per pt wife to assist with 24 hour supervision and care. Per pt [facility] to provide HHPT. No other HH services requested. Pt request [facility] to provide DME.    Roxy Manns Juliocesar Blasius,RN,BSN [phone]

## 2011-12-14 NOTE — Progress Notes (Signed)
Physical Therapy Treatment Patient Details Name: [name] MRN: [id number] DOB: 12-05-1945 Today's Date: 12/14/2011  PT Assessment/Plan  PT - Assessment/Plan Comments on Treatment Session: The patient is progressing quickly.  Should be able to d/c home safely after practicing the stairs tomorrow AM.   PT Plan: Discharge plan remains appropriate PT Frequency: 7X/week Follow Up Recommendations: Home health PT Equipment Recommended: Rolling walker with 5" wheels PT Goals  Acute Rehab PT Goals Pt will go Sit to Supine/Side: Independently;with HOB 0 degrees PT Goal: Sit to Supine/Side - Progress: Updated due to goal met PT Goal: Sit to Stand - Progress: Progressing toward goal PT Goal: Stand to Sit - Progress: Progressing toward goal PT Goal: Ambulate - Progress: Progressing toward goal PT Goal: Perform Home Exercise Program - Progress: Progressing toward goal  PT Treatment Precautions/Restrictions  Precautions Required Braces or Orthoses: Yes Knee Immobilizer: On except when in CPM Restrictions Weight Bearing Restrictions: Yes RLE Weight Bearing: Weight bearing as tolerated Mobility (including Balance) Bed Mobility Sit to Supine: 6: Modified independent (Device/Increase time);With rail;HOB flat Scooting to HOB: 6: Modified independent (Device/Increase time);With rail Transfers Sit to Stand: 5: Supervision Sit to Stand Details (indicate cue type and reason): supervision for safety Stand to Sit: 5: Supervision Stand to Sit Details: supervision for safety Ambulation/Gait Ambulation/Gait: Yes Ambulation/Gait Assistance: 4: Min assist Ambulation/Gait Assistance Details (indicate cue type and reason): min guard assist for safety secondary to still had a moderately antaligic gait pattern and the patient is quick to move.   Ambulation Distance (Feet): 180 Feet Assistive device: Rolling walker Gait Pattern: Step-to pattern;Antalgic    Exercise  Total Joint Exercises Ankle  Circles/Pumps: AROM;10 reps Quad Sets: AROM;10 reps Heel Slides: AAROM;10 reps Straight Leg Raises: AAROM;10 reps End of Session PT - End of Session Equipment Utilized During Treatment:  (RW) Activity Tolerance: Patient tolerated treatment well Patient left: in bed General Behavior During Session: [facility] for tasks performed Cognition: [facility] for tasks performed  Alyshia Kernan B. Jd Mccaster, PT, DPT [phone]  12/14/2011, 1:55 PM

## 2011-12-15 LAB — CBC
HCT: 30.5 % — ABNORMAL LOW (ref 39.0–52.0)
MCV: 93.3 fL (ref 78.0–100.0)
Platelets: 140 10*3/uL — ABNORMAL LOW (ref 150–400)
RBC: 3.27 MIL/uL — ABNORMAL LOW (ref 4.22–5.81)
RDW: 13.4 % (ref 11.5–15.5)
WBC: 6.4 10*3/uL (ref 4.0–10.5)

## 2011-12-15 MED ORDER — FERROUS SULFATE 325 (65 FE) MG PO TABS: 325.0000 mg | ORAL_TABLET | Freq: Three times a day (TID) | ORAL | Status: DC

## 2011-12-15 MED ORDER — OXYCODONE-ACETAMINOPHEN 5-325 MG PO TABS: 1.0000 | ORAL_TABLET | ORAL | Status: AC | PRN

## 2011-12-15 MED ORDER — ENOXAPARIN SODIUM 30 MG/0.3ML ~~LOC~~ SOLN: 30.0000 mg | Freq: Two times a day (BID) | SUBCUTANEOUS | Status: DC

## 2011-12-15 MED ORDER — METHOCARBAMOL 500 MG PO TABS: 500.0000 mg | ORAL_TABLET | Freq: Four times a day (QID) | ORAL | Status: AC | PRN

## 2011-12-15 NOTE — Progress Notes (Signed)
Subjective: The patient denies any problems as comfortable feels he is doing better this time than his left total knee arthroplasty in the past states he is ready to go home   Objective: Vital signs in last 24 hours: Temp:  [98.1 F (36.7 C)-98.4 F (36.9 C)] 98.1 F (36.7 C) (01/28 0540) Pulse Rate:  [66-78] 76  (01/28 0540) Resp:  [17-18] 18  (01/28 0540) BP: (123-142)/(73-88) 134/88 mmHg (01/28 0540) SpO2:  [94 %-96 %] 94 % (01/28 0540)  Intake/Output from previous day: 01/27 0701 - 01/28 0700 In: 1821.3 [P.O.:1340; I.V.:481.3] Out: 1950 [Urine:1950] Intake/Output this shift:     Basename 12/15/11 0350 12/14/11 0520 12/13/11 0520  HGB 10.2* 11.6* 11.3*    Basename 12/15/11 0350 12/14/11 0520  WBC 6.4 9.7  RBC 3.27* 3.73*  HCT 30.5* 34.7*  PLT 140* 164    Basename 12/14/11 0520 12/13/11 0520  NA 134* 133*  K 3.7 3.9  CL 99 99  CO2 25 25  BUN 9 11  CREATININE 0.85 0.67  GLUCOSE 138* 146*  CALCIUM 9.3 9.0   No results found for this basename: LABPT:2,INR:2 in the last 72 hours  Patient's conscious alert and appropriate sitting up in bed watching TV in no distress. His dressing on his right total knee arthroplasty wound was taken down well approximated with Steri-Strips there is no erythema no blisters no drainage his calf and thigh are soft nontender his leg is neuromotor vascularly intact  Assessment/Plan: Postop day #3 min status post right total knee arthroplasty doing very well Mild hyponatremia asymptomatic continue to self correct with DC'd IV hydration  routine postoperative blood loss asymptomatic rollout self correct with by mouth supplementation on iron. Plan we'll DC IV today DC'd home after a physical therapy with 2 week followup Lovenox for DVT prophylaxis also arrange home health physical therapy   Jamelle Rushing 12/15/2011, 7:03 AM

## 2011-12-15 NOTE — Progress Notes (Signed)
Physical Therapy Treatment Patient [id number] g  Name: [name] MRN: [id number] DOB: February 25, 1946 Today's Date: 12/15/2011  PT Assessment/Plan  PT - Assessment/Plan Comments on Treatment Session: pt  is ambulating with no KI ,has met goals, practiced steps, ready for DC Follow Up Recommendations: Home health PT Equipment Recommended: Rolling walker with 5" wheels PT Goals  Acute Rehab PT Goals Pt will go Sit to Supine/Side: Independently;with HOB 0 degrees (pt observed to get up with no assist prior to treatment) Pt will go Sit to Stand: with modified independence PT Goal: Sit to Stand - Progress: Progressing toward goal Pt will go Stand to Sit: with modified independence PT Goal: Stand to Sit - Progress: Progressing toward goal Pt will Ambulate: >150 feet;with modified independence;with rolling walker PT Goal: Ambulate - Progress: Met Pt will Go Up / Down Stairs: 3-5 stairs;with rail(s);with min assist;with least restrictive assistive device PT Goal: Up/Down Stairs - Progress: Met Pt will Perform Home Exercise Program: Independently PT Goal: Perform Home Exercise Program - Progress: Met  PT Treatment Precautions/Restrictions  Precautions Required Braces or Orthoses: Yes Knee Immobilizer: On except when in CPM Restrictions Weight Bearing Restrictions: Yes RLE Weight Bearing: Weight bearing as tolerated Mobility (including Balance) Bed Mobility Bed Mobility: No Transfers Sit to Stand: 5: Supervision Sit to Stand Details (indicate cue type and reason): pt able to tolerate knee flexion prior to stand Stand to Sit: 5: Supervision;To chair/3-in-1 Stand to Sit Details: VC for hand placement for safety from RW Ambulation/Gait Ambulation/Gait Assistance: 5: Supervision Ambulation/Gait Assistance Details (indicate cue type and reason): vc for sequence Ambulation Distance (Feet): 100 Feet Assistive device: Rolling walker Gait Pattern: Step-to pattern Stairs: Yes Stairs  Assistance: 4: Min assist Stairs Assistance Details (indicate cue type and reason): vc for sequence Stair Management Technique: With crutches;Forwards;One rail Left Number of Stairs: 5  Height of Stairs: 6     Exercise  Total Joint Exercises Ankle Circles/Pumps: AROM;10 reps Quad Sets: AROM;10 reps Heel Slides: Seated;AROM;Right;10 reps Hip ABduction/ADduction: AROM;Right;10 reps;Seated Straight Leg Raises: AROM;Right;10 reps;Supine Long Arc Quad: AROM;Right;10 reps;Seated Knee Flexion: AROM;Right;10 reps;Seated End of Session PT - End of Session Activity Tolerance: Patient tolerated treatment well Patient left: in chair;with family/visitor present Nurse Communication:  (ready for DC) General Behavior During Session: [facility] for tasks performed Cognition: [facility] for tasks performed  Rada Hay 12/15/2011, 1:31 PM

## 2011-12-15 NOTE — Discharge Summary (Signed)
Physician Discharge Summary  Patient ID: [name] MRN: [id number] DOB/AGE: [birth year] 66 y.o.  Admit date: 12/12/2011 Discharge date: 12/15/2011  Admission Diagnoses: End-stage arthritis right knee History of left total knee arthroplasty History of sleep apnea on nasal cannula O2 at night Hypertension Hypercholesterolemia MRS a positive on preop screening  Discharge Diagnoses:  Right total knee arthroplasty without complications Routine postoperative blood loss anemia will a while to self correct with iron supplementation Asymptomatic postoperative hyponatremia allow up to self correct MRSA positive asymptomatic vancomycin treatment perioperative History of sleep apnea on nasal cannula O2 at night next Hypertension Hypercholesterolemia   Discharged Condition: good patient is conscious alert for  Hospital Course: Patient was admitted at [facility] under the care Dr. Valma Cava taken to the OR where right total knee arthroplasty was performed without any complications. Patient was transferred to her room and orthopedic floor in good condition on IV antibiotics vancomycin per MRSA protocol. Lovenox for DVT prophylaxis. Patient had 3 days postoperative care on the orthopedic floor with a total knee protocol without any complications. He developed a symptomatic hyponatremia treated with these decreasing IV hydration. Routine postoperative blood loss anemia asymptomatic rollout self correct with iron supplementation. Patient's Hemovac drain was DC'd on postop day #1 without a problem. Dressing changed on postop day #2 wound was benign for any signs of infection drainage or blisters. Patient worked well with physical therapy and CPM on a daily basis and on postop day #3 his vital signs were stable his wound was benign for any signs of infection and patient was ready for discharge home in good condition. Patient had home health physical therapy CPM a followup with Dr. Thomasena Edis  range  Consults: None  Significant Diagnostic Studies: labs: Routine postop  Treatments: Routine postop total knee arthroplasty treatment  Discharge Exam: Blood pressure 134/88, pulse 76, temperature 98.1 F (36.7 C), temperature source Oral, resp. rate 18, height 5\' 8"  (1.727 m), weight 102.059 kg (225 lb), SpO2 94.00%. Patient is conscious alert appropriate in no distress his right lower extremity wound was benign for any signs of infection well approximate Steri-Strips his leg was neuromotor vascularly intact.  Disposition: Discharge to home in good condition with we'll routine 2 week followup appointment with Dr. Thomasena Edis. Home health physical therapy. CPM. Lovenox for DVT prophylaxis    Medication List  As of 12/15/2011  7:09 AM   ASK your doctor about these medications         amLODipine-benazepril 5-20 MG per capsule   Commonly known as: LOTREL   Take 1 capsule by mouth 2 (two) times daily.      aspirin EC 81 MG tablet   Take 81 mg by mouth daily with breakfast.      bisoprolol-hydrochlorothiazide 2.5-6.25 MG per tablet   Commonly known as: ZIAC   Take 1 tablet by mouth daily with breakfast.      cholecalciferol 1000 UNITS tablet   Commonly known as: VITAMIN D   Take 1,000 Units by mouth daily with breakfast.      ezetimibe 10 MG tablet   Commonly known as: ZETIA   Take 5 mg by mouth daily with breakfast.      fish oil-omega-3 fatty acids 1000 MG capsule   Take 3 g by mouth daily with breakfast.      fluticasone 50 MCG/ACT nasal spray   Commonly known as: FLONASE   Place 2 sprays into the nose daily. Pt takes as needed  gabapentin 300 MG capsule   Commonly known as: NEURONTIN   Take 300 mg by mouth 3 (three) times daily.      GLUCOSAMINE CHONDR 1500 COMPLX PO   Take 1,500 mg by mouth daily with breakfast.      omeprazole 10 MG capsule   Commonly known as: PRILOSEC   Take 10 mg by mouth daily.      Osteo Bi-Flex Adv Joint Shield Tabs   Take 1 tablet by  mouth daily.      rosuvastatin 20 MG tablet   Commonly known as: CRESTOR   Take 20 mg by mouth daily with breakfast.      vitamin B-12 1000 MCG tablet   Commonly known as: CYANOCOBALAMIN   Take 1,000 mcg by mouth daily with breakfast.             Signed: Jamelle Rushing 12/15/2011, 7:09 AM

## 2011-12-16 ENCOUNTER — Encounter (HOSPITAL_COMMUNITY): Payer: Self-pay | Admitting: Specialist

## 2011-12-30 ENCOUNTER — Ambulatory Visit: Payer: BC Managed Care – PPO | Attending: Specialist | Admitting: Physical Therapy

## 2011-12-30 DIAGNOSIS — IMO0001 Reserved for inherently not codable concepts without codable children: Secondary | ICD-10-CM | POA: Insufficient documentation

## 2011-12-30 DIAGNOSIS — Z96659 Presence of unspecified artificial knee joint: Secondary | ICD-10-CM | POA: Insufficient documentation

## 2011-12-30 DIAGNOSIS — R5381 Other malaise: Secondary | ICD-10-CM | POA: Insufficient documentation

## 2011-12-30 DIAGNOSIS — M25669 Stiffness of unspecified knee, not elsewhere classified: Secondary | ICD-10-CM | POA: Insufficient documentation

## 2011-12-30 DIAGNOSIS — M25569 Pain in unspecified knee: Secondary | ICD-10-CM | POA: Insufficient documentation

## 2011-12-31 ENCOUNTER — Ambulatory Visit: Payer: BC Managed Care – PPO | Admitting: Physical Therapy

## 2012-01-06 ENCOUNTER — Ambulatory Visit: Payer: BC Managed Care – PPO | Admitting: *Deleted

## 2012-01-08 ENCOUNTER — Ambulatory Visit: Payer: BC Managed Care – PPO | Admitting: *Deleted

## 2012-01-13 ENCOUNTER — Ambulatory Visit: Payer: BC Managed Care – PPO | Admitting: Physical Therapy

## 2012-01-15 ENCOUNTER — Ambulatory Visit: Payer: BC Managed Care – PPO | Admitting: Physical Therapy

## 2012-01-20 ENCOUNTER — Ambulatory Visit: Payer: BC Managed Care – PPO | Attending: Specialist | Admitting: Physical Therapy

## 2012-01-20 DIAGNOSIS — M25669 Stiffness of unspecified knee, not elsewhere classified: Secondary | ICD-10-CM | POA: Insufficient documentation

## 2012-01-20 DIAGNOSIS — Z96659 Presence of unspecified artificial knee joint: Secondary | ICD-10-CM | POA: Insufficient documentation

## 2012-01-20 DIAGNOSIS — R5381 Other malaise: Secondary | ICD-10-CM | POA: Insufficient documentation

## 2012-01-20 DIAGNOSIS — IMO0001 Reserved for inherently not codable concepts without codable children: Secondary | ICD-10-CM | POA: Insufficient documentation

## 2012-01-20 DIAGNOSIS — M25569 Pain in unspecified knee: Secondary | ICD-10-CM | POA: Insufficient documentation

## 2012-01-22 ENCOUNTER — Ambulatory Visit: Payer: BC Managed Care – PPO | Admitting: Physical Therapy

## 2012-01-27 ENCOUNTER — Ambulatory Visit: Payer: BC Managed Care – PPO | Admitting: Physical Therapy

## 2012-01-29 ENCOUNTER — Ambulatory Visit: Payer: BC Managed Care – PPO | Admitting: Physical Therapy

## 2012-02-03 ENCOUNTER — Ambulatory Visit: Payer: BC Managed Care – PPO | Admitting: Physical Therapy

## 2012-02-06 ENCOUNTER — Ambulatory Visit: Payer: BC Managed Care – PPO | Admitting: *Deleted

## 2012-02-10 ENCOUNTER — Ambulatory Visit: Payer: BC Managed Care – PPO | Admitting: Physical Therapy

## 2012-02-12 ENCOUNTER — Ambulatory Visit: Payer: BC Managed Care – PPO | Admitting: Physical Therapy

## 2012-02-17 ENCOUNTER — Ambulatory Visit: Payer: BC Managed Care – PPO | Attending: Specialist | Admitting: *Deleted

## 2012-02-17 DIAGNOSIS — Z96659 Presence of unspecified artificial knee joint: Secondary | ICD-10-CM | POA: Insufficient documentation

## 2012-02-17 DIAGNOSIS — M25669 Stiffness of unspecified knee, not elsewhere classified: Secondary | ICD-10-CM | POA: Insufficient documentation

## 2012-02-17 DIAGNOSIS — R5381 Other malaise: Secondary | ICD-10-CM | POA: Insufficient documentation

## 2012-02-17 DIAGNOSIS — M25569 Pain in unspecified knee: Secondary | ICD-10-CM | POA: Insufficient documentation

## 2012-02-17 DIAGNOSIS — IMO0001 Reserved for inherently not codable concepts without codable children: Secondary | ICD-10-CM | POA: Insufficient documentation

## 2012-02-19 ENCOUNTER — Ambulatory Visit: Payer: BC Managed Care – PPO | Admitting: *Deleted

## 2012-02-24 ENCOUNTER — Ambulatory Visit: Payer: BC Managed Care – PPO | Admitting: *Deleted

## 2012-02-26 ENCOUNTER — Ambulatory Visit: Payer: BC Managed Care – PPO | Admitting: *Deleted

## 2013-09-26 ENCOUNTER — Encounter: Payer: Self-pay | Admitting: Gastroenterology

## 2013-10-25 ENCOUNTER — Encounter: Payer: Self-pay | Admitting: Gastroenterology

## 2013-10-25 ENCOUNTER — Ambulatory Visit (INDEPENDENT_AMBULATORY_CARE_PROVIDER_SITE_OTHER): Payer: Medicare Other | Admitting: Gastroenterology

## 2013-10-25 VITALS — BP 128/80 | HR 80 | Ht 68.0 in | Wt 255.4 lb

## 2013-10-25 DIAGNOSIS — R195 Other fecal abnormalities: Secondary | ICD-10-CM

## 2013-10-25 DIAGNOSIS — K921 Melena: Secondary | ICD-10-CM

## 2013-10-25 MED ORDER — PEG-KCL-NACL-NASULF-NA ASC-C 100 G PO SOLR: 1.0000 | Freq: Once | ORAL | Status: DC

## 2013-10-25 NOTE — Patient Instructions (Addendum)
You have been scheduled for a colonoscopy with propofol. Please follow written instructions given to you at your visit today.  Please pick up your prep kit at the pharmacy within the next 1-3 days. If you use inhalers (even only as needed), please bring them with you on the day of your procedure.  Thank you for choosing me and [facility].  Venita Lick. Pleas Koch., MD., Clementeen Graham  cc: Rodrigo Ran, MD

## 2013-10-25 NOTE — Progress Notes (Signed)
    History of Present Illness: This is a 67 year old male who recently had a positive iFOB stool test in Dr. Laurey Morale office. He states he notes occasionally scant amounts of bright red blood on the tissue paper. He underwent colonoscopy in February 2009 showing diverticulosis and internal hemorrhoids. Denies weight loss, abdominal pain, constipation, diarrhea, change in stool caliber, melena, nausea, vomiting, dysphagia, reflux symptoms, chest pain.  Review of Systems: Pertinent positive and negative review of systems were noted in the above HPI section. All other review of systems were otherwise negative.  Current Medications, Allergies, Past Medical History, Past Surgical History, Family History and Social History were reviewed in [employer] record.  Physical Exam: General: Well developed , well nourished, no acute distress Head: Normocephalic and atraumatic Eyes:  sclerae anicteric, EOMI Ears: Normal auditory acuity Mouth: No deformity or lesions Neck: Supple, no masses or thyromegaly Lungs: Clear throughout to auscultation Heart: Regular rate and rhythm; no murmurs, rubs or bruits Abdomen: Soft, non tender and non distended. No masses, hepatosplenomegaly or hernias noted. Normal Bowel sounds Rectal: deferred to colonoscopy Musculoskeletal: Symmetrical with no gross deformities  Skin: No lesions on visible extremities Pulses:  Normal pulses noted Extremities: No clubbing, cyanosis, edema or deformities noted Neurological: Alert oriented x 4, grossly nonfocal Cervical Nodes:  No significant cervical adenopathy Inguinal Nodes: No significant inguinal adenopathy Psychological:  Alert and cooperative. Normal mood and affect  Assessment and Recommendations:  1. Heme pos stool and infrequent small volume hematochezia. Rule out colorectal neoplasms. Bleeding could be from internal hemorrhoids. R/O colorectal neoplasms. The risks, benefits, and alternatives to  colonoscopy with possible biopsy and possible polypectomy were discussed with the patient and they consent to proceed.

## 2013-10-26 ENCOUNTER — Encounter: Payer: Self-pay | Admitting: Gastroenterology

## 2013-12-18 DIAGNOSIS — D126 Benign neoplasm of colon, unspecified: Secondary | ICD-10-CM

## 2013-12-18 HISTORY — DX: Benign neoplasm of colon, unspecified: D12.6

## 2013-12-22 ENCOUNTER — Ambulatory Visit (AMBULATORY_SURGERY_CENTER): Payer: Medicare Other | Admitting: Gastroenterology

## 2013-12-22 ENCOUNTER — Encounter: Payer: Self-pay | Admitting: Gastroenterology

## 2013-12-22 VITALS — BP 139/77 | HR 60 | Temp 98.1°F | Resp 22 | Ht 68.0 in | Wt 255.0 lb

## 2013-12-22 DIAGNOSIS — D126 Benign neoplasm of colon, unspecified: Secondary | ICD-10-CM

## 2013-12-22 DIAGNOSIS — R195 Other fecal abnormalities: Secondary | ICD-10-CM

## 2013-12-22 DIAGNOSIS — K921 Melena: Secondary | ICD-10-CM

## 2013-12-22 MED ORDER — SODIUM CHLORIDE 0.9 % IV SOLN: 500.0000 mL | INTRAVENOUS | Status: DC

## 2013-12-22 NOTE — Patient Instructions (Signed)

## 2013-12-22 NOTE — Progress Notes (Signed)
Called to room to assist during endoscopic procedure.  Patient ID and intended procedure confirmed with present staff. Received instructions for my participation in the procedure from the performing physician.  

## 2013-12-22 NOTE — Progress Notes (Signed)
Lidocaine-40mg IV prior to Propofol InductionPropofol given over incremental dosages 

## 2013-12-22 NOTE — Op Note (Signed)
[place]  [address]. [place], [address]   COLONOSCOPY PROCEDURE REPORT  PATIENT: Rasul, Decola  MR#: [id number] BIRTHDATE: 04-18-1946 , 35  yrs. old GENDER: Male ENDOSCOPIST: Ladene Artist, MD, [facility] PROCEDURE DATE:  12/22/2013 PROCEDURE:   Colonoscopy with biopsy and snare polypectomy First Screening Colonoscopy - Avg.  risk and is 50 yrs.  old or older - No.  Prior Negative Screening - Now for repeat screening. N/A  History of Adenoma - Now for follow-up colonoscopy & has been > or = to 3 yrs.  N/A  Polyps Removed Today? Yes. ASA CLASS:   Class II INDICATIONS:heme-positive stool and rectal bleeding. MEDICATIONS: MAC sedation, administered by CRNA and propofol (Diprivan) 250mg  IV DESCRIPTION OF PROCEDURE:   After the risks benefits and alternatives of the procedure were thoroughly explained, informed consent was obtained.  A digital rectal exam revealed no abnormalities of the rectum.   The LB [id number]  endoscope was introduced through the anus and advanced to the cecum, which was identified by both the appendix and ileocecal valve. No adverse events experienced.   The quality of the prep was adequate, using MoviPrep  The instrument was then slowly withdrawn as the colon was fully examined.  COLON FINDINGS: A sessile polyp measuring 4 mm in size was found at the cecum.  A polypectomy was performed with cold forceps.  The resection was complete and the polyp tissue was completely retrieved.   Four sessile polyps measuring 4-5 mm in size were found at the hepatic flexure, in the transverse colon, descending colon, and rectum.  A polypectomy was performed with a cold snare. The resection was complete and the polyp tissue was completely retrieved.   The colon was otherwise normal.  There was no diverticulosis, inflammation, polyps or cancers unless previously stated.  Retroflexed views revealed small internal hemorrhoids. The time to cecum=1  minutes 22 seconds.  Withdrawal time=11 minutes 38 seconds.  The scope was withdrawn and the procedure completed. COMPLICATIONS: There were no complications.  ENDOSCOPIC IMPRESSION: 1.   Sessile polyp measuring 4 mm at the cecum; polypectomy performed with cold forceps 2.   Four sessile polyps measuring 4-5 mm hepatic flexure, transverse, descending colon, and rectum; polypectomy  performed with a cold snare 3.   Small internal hemorrhoids  RECOMMENDATIONS: 1.  Await pathology results 2.  Repeat colonoscopy in 5 years if polyp(s) adenomatous; otherwise 10 years  eSigned:  Ladene Artist, MD, [facility] 12/22/2013 11:31 AM   cc: Crist Infante, MD

## 2013-12-23 ENCOUNTER — Telehealth: Payer: Self-pay | Admitting: *Deleted

## 2013-12-23 NOTE — Telephone Encounter (Signed)
  Follow up Call-  Call back number 12/22/2013  Post procedure Call Back phone  # [phone]  Permission to leave phone message Yes     Patient questions:  Do you have a fever, pain , or abdominal swelling? no Pain Score  0 *  Have you tolerated food without any problems? yes  Have you been able to return to your normal activities? yes  Do you have any questions about your discharge instructions: Diet   no Medications  no Follow up visit  no  Do you have questions or concerns about your Care? no  Actions: * If pain score is 4 or above: No action needed, pain <4.

## 2013-12-27 ENCOUNTER — Encounter: Payer: Self-pay | Admitting: Gastroenterology

## 2015-09-14 ENCOUNTER — Emergency Department (HOSPITAL_COMMUNITY)
Admission: EM | Admit: 2015-09-14 | Discharge: 2015-09-15 | Disposition: A | Discharge status: Home / Self Care | Payer: Medicare Other | Attending: Emergency Medicine | Admitting: Emergency Medicine

## 2015-09-14 ENCOUNTER — Encounter (HOSPITAL_COMMUNITY): Payer: Self-pay | Admitting: Emergency Medicine

## 2015-09-14 DIAGNOSIS — M158 Other polyosteoarthritis: Secondary | ICD-10-CM | POA: Diagnosis not present

## 2015-09-14 DIAGNOSIS — Z87438 Personal history of other diseases of male genital organs: Secondary | ICD-10-CM | POA: Insufficient documentation

## 2015-09-14 DIAGNOSIS — Z8719 Personal history of other diseases of the digestive system: Secondary | ICD-10-CM | POA: Insufficient documentation

## 2015-09-14 DIAGNOSIS — I10 Essential (primary) hypertension: Secondary | ICD-10-CM | POA: Insufficient documentation

## 2015-09-14 DIAGNOSIS — R55 Syncope and collapse: Secondary | ICD-10-CM | POA: Insufficient documentation

## 2015-09-14 DIAGNOSIS — R011 Cardiac murmur, unspecified: Secondary | ICD-10-CM | POA: Diagnosis not present

## 2015-09-14 DIAGNOSIS — G629 Polyneuropathy, unspecified: Secondary | ICD-10-CM | POA: Insufficient documentation

## 2015-09-14 DIAGNOSIS — Z7951 Long term (current) use of inhaled steroids: Secondary | ICD-10-CM | POA: Diagnosis not present

## 2015-09-14 DIAGNOSIS — E11649 Type 2 diabetes mellitus with hypoglycemia without coma: Secondary | ICD-10-CM | POA: Diagnosis not present

## 2015-09-14 DIAGNOSIS — E785 Hyperlipidemia, unspecified: Secondary | ICD-10-CM | POA: Insufficient documentation

## 2015-09-14 DIAGNOSIS — Z87891 Personal history of nicotine dependence: Secondary | ICD-10-CM | POA: Insufficient documentation

## 2015-09-14 DIAGNOSIS — Z7982 Long term (current) use of aspirin: Secondary | ICD-10-CM | POA: Insufficient documentation

## 2015-09-14 DIAGNOSIS — R51 Headache: Secondary | ICD-10-CM | POA: Diagnosis present

## 2015-09-14 LAB — COMPREHENSIVE METABOLIC PANEL
ALBUMIN: 3.6 g/dL (ref 3.5–5.0)
ALK PHOS: 63 U/L (ref 38–126)
ALT: 15 U/L — ABNORMAL LOW (ref 17–63)
ANION GAP: 13 (ref 5–15)
AST: 20 U/L (ref 15–41)
BUN: 8 mg/dL (ref 6–20)
CALCIUM: 9.4 mg/dL (ref 8.9–10.3)
CHLORIDE: 98 mmol/L — AB (ref 101–111)
CO2: 23 mmol/L (ref 22–32)
Creatinine, Ser: 0.81 mg/dL (ref 0.61–1.24)
GFR calc non Af Amer: >60 mL/min (ref >60)
Glucose, Bld: 113 mg/dL — ABNORMAL HIGH (ref 65–99)
POTASSIUM: 3.6 mmol/L (ref 3.5–5.1)
SODIUM: 134 mmol/L — AB (ref 135–145)
Total Bilirubin: 0.5 mg/dL (ref 0.3–1.2)
Total Protein: 6.4 g/dL — ABNORMAL LOW (ref 6.5–8.1)

## 2015-09-14 LAB — CBC WITH DIFFERENTIAL/PLATELET
BASOS PCT: 0 %
Basophils Absolute: 0 10*3/uL (ref 0.0–0.1)
EOS ABS: 0.2 10*3/uL (ref 0.0–0.7)
EOS PCT: 3 %
HCT: 36.8 % — ABNORMAL LOW (ref 39.0–52.0)
HEMOGLOBIN: 12.7 g/dL — AB (ref 13.0–17.0)
Lymphocytes Relative: 31 %
Lymphs Abs: 1.8 10*3/uL (ref 0.7–4.0)
MCH: 32.5 pg (ref 26.0–34.0)
MCHC: 34.5 g/dL (ref 30.0–36.0)
MCV: 94.1 fL (ref 78.0–100.0)
MONOS PCT: 7 %
Monocytes Absolute: 0.4 10*3/uL (ref 0.1–1.0)
NEUTROS PCT: 59 %
Neutro Abs: 3.4 10*3/uL (ref 1.7–7.7)
PLATELETS: 225 10*3/uL (ref 150–400)
RBC: 3.91 MIL/uL — ABNORMAL LOW (ref 4.22–5.81)
RDW: 13.2 % (ref 11.5–15.5)
WBC: 5.7 10*3/uL (ref 4.0–10.5)

## 2015-09-14 LAB — URINALYSIS, ROUTINE W REFLEX MICROSCOPIC
BILIRUBIN URINE: NEGATIVE
Glucose, UA: NEGATIVE mg/dL
Hgb urine dipstick: NEGATIVE
KETONES UR: NEGATIVE mg/dL
LEUKOCYTES UA: NEGATIVE
NITRITE: NEGATIVE
PH: 5 (ref 5.0–8.0)
PROTEIN: NEGATIVE mg/dL
Specific Gravity, Urine: 1.007 (ref 1.005–1.030)
UROBILINOGEN UA: 0.2 mg/dL (ref 0.0–1.0)

## 2015-09-14 LAB — CBG MONITORING, ED: Glucose-Capillary: 105 mg/dL — ABNORMAL HIGH (ref 65–99)

## 2015-09-14 LAB — ETHANOL: Alcohol, Ethyl (B): 102 mg/dL — ABNORMAL HIGH (ref <5)

## 2015-09-14 MED ORDER — SODIUM CHLORIDE 0.9 % IV BOLUS (SEPSIS): 1000.0000 mL | Freq: Once | INTRAVENOUS | Status: DC

## 2015-09-14 NOTE — ED Notes (Addendum)
Pt. fell twice this evening , denies injury , ambulatory / no LOC , pt. stated that he drank 8 bottles of beer and 2 glasses of wine this evening , reports mild headache , pt. added low blood sugar =62 and hypotension 90/40 today . Spouse reported that pt. has been drinking a lot for the past several days .

## 2015-09-14 NOTE — ED Notes (Signed)
Ns bolus started.

## 2015-09-14 NOTE — ED Notes (Signed)
Patient reports he usually has 6-8 beers, and today had 2 wine. Sometimes patient has both beer and wine, and reports taking alcohol consumption along with medications. Advised patient this isn't safe.

## 2015-09-14 NOTE — ED Notes (Signed)
Dr. Delo at the bedside 

## 2015-09-14 NOTE — ED Provider Notes (Signed)
CSN: [id number]     Arrival date & time 09/14/15  2229 History  By signing my name below, I, [name], attest that this documentation has been prepared under the direction and in the presence of [name], MD. Electronically Signed: Julien Nordmann, ED Scribe. 09/14/2015. 11:29 PM.    Chief Complaint  Patient presents with  . Fall  . Hypoglycemia  . Hypotension      The history is provided by the patient. No language interpreter was used.   HPI Comments: [name] is a 69 y.o. male who has multiple PMHx presents to the Emergency Department complaining of intermittent, gradual worsening hypoglycemia and hypotension onset this evening. Pt has an associated headache and swelling in both legs. He reports his blood sugar was about 62 tonight and notes his blood pressure was intermittently dropping with the lowest being 90/57 at one point tonight. Pt states that his legs gave out on him to where he had to crawl on the floor and was unable to walk. Pt reports borderline excessive drinking this evening. He denies vomiting, diarrhea, hx of heart problems, back pain, and abdominal pain.     Past Medical History  Diagnosis Date  . Hypertension   . Heart murmur     pt states he " outgrew"   . GERD (gastroesophageal reflux disease)   . Arthritis     knees, right foot   . Sleep apnea     does not use machine , uses 02 2l/[place] at nite   . Diverticulosis   . Internal hemorrhoids   . Anal fissure   . Hyperlipidemia   . DJD (degenerative joint disease)   . BPH (benign prostatic hypertrophy)   . Neuropathy of foot   . ED (erectile dysfunction)    Past Surgical History  Procedure Laterality Date  . Joint replacement      left knee replacement   . Total knee arthroplasty  12/12/2011    Procedure: TOTAL KNEE ARTHROPLASTY;  Surgeon: Cynda Familia, MD;  Location: [facility];  Service: Orthopedics;  Laterality: Right;  . Anal fissure repair    . Foot fracture surgery     Family  History  Problem Relation Age of Onset  . Heart disease Mother   . Heart disease Father   . Heart disease Brother   . Colon cancer Maternal Grandmother    Social History  Substance Use Topics  . Smoking status: Former Smoker    Types: Cigarettes    Quit date: 11/17/1981  . Smokeless tobacco: Former Systems developer    Types: Snuff    Quit date: 11/17/2001  . Alcohol Use: 4.8 oz/week    8 Cans of beer per week    Review of Systems  Gastrointestinal: Negative for vomiting, abdominal pain and diarrhea.  All other systems reviewed and are negative.     Allergies  Review of patient's allergies indicates no known allergies.  Home Medications   Prior to Admission medications   Medication Sig Start Date End Date Taking? Authorizing Provider  amLODipine-benazepril (LOTREL) 5-20 MG per capsule Take 1 capsule by mouth 2 (two) times daily.    Historical Provider, MD  aspirin 81 MG tablet Take 81 mg by mouth daily.    Historical Provider, MD  bisoprolol-hydrochlorothiazide (ZIAC) 2.5-6.25 MG per tablet Take 1 tablet by mouth daily with breakfast.    Historical Provider, MD  cholecalciferol (VITAMIN D) 1000 UNITS tablet Take 1,000 Units by mouth daily with breakfast.    Historical  Provider, MD  ezetimibe (ZETIA) 10 MG tablet Take 5 mg by mouth daily with breakfast.    Historical Provider, MD  fish oil-omega-3 fatty acids 1000 MG capsule Take 1 g by mouth daily with breakfast.     Historical Provider, MD  fluticasone (FLONASE) 50 MCG/ACT nasal spray Place 2 sprays into the nose daily. Pt takes as needed    Historical Provider, MD  gabapentin (NEURONTIN) 300 MG capsule Take 300 mg by mouth 3 (three) times daily.    Historical Provider, MD  Glucosamine-Chondroitin (OSTEO BI-FLEX REGULAR STRENGTH PO) Take 1,000 mg by mouth.    Historical Provider, MD  rosuvastatin (CRESTOR) 20 MG tablet Take 20 mg by mouth daily with breakfast.    Historical Provider, MD  vitamin B-12 (CYANOCOBALAMIN) 1000 MCG tablet  Take 1,000 mcg by mouth daily with breakfast.    Historical Provider, MD   Triage vitals: BP 123/68 mmHg  Pulse 66  Temp(Src) 97.7 F (36.5 C) (Oral)  Resp 18  Ht 5\' 8"  (1.727 m)  Wt 250 lb (113.399 kg)  BMI 38.02 kg/m2  SpO2 96% Physical Exam  Constitutional: He is oriented to person, place, and time. He appears well-developed and well-nourished.  HENT:  Head: Normocephalic and atraumatic.  Eyes: EOM are normal.  Neck: Normal range of motion.  Cardiovascular: Normal rate, regular rhythm, normal heart sounds and intact distal pulses.   Pulmonary/Chest: Effort normal and breath sounds normal. No respiratory distress.  Abdominal: Soft. He exhibits no distension. There is no tenderness.  Musculoskeletal: Normal range of motion.  Neurological: He is alert and oriented to person, place, and time.  Skin: Skin is warm and dry.  Psychiatric: He has a normal mood and affect. Judgment normal.  Nursing note and vitals reviewed.   ED Course  Procedures  DIAGNOSTIC STUDIES: Oxygen Saturation is 96% on RA, adequate by my interpretation.  COORDINATION OF CARE:  11:16 PM Discussed treatment plan which includes lab work, bag of fluid with pt at bedside and pt agreed to plan.  Labs Review Labs Reviewed  CBG MONITORING, ED - Abnormal; Notable for the following:    Glucose-Capillary 105 (*)    All other components within normal limits  ETHANOL  CBC WITH DIFFERENTIAL/PLATELET  COMPREHENSIVE METABOLIC PANEL  URINALYSIS, ROUTINE W REFLEX MICROSCOPIC (NOT AT [facility])    Imaging Review No results found. I have personally reviewed and evaluated these images and lab results as part of my medical decision-making.   EKG Interpretation   Date/Time:  Friday September 14 2015 22:39:31 EDT Ventricular Rate:  74 PR Interval:  208 QRS Duration: 82 QT Interval:  420 QTC Calculation: 466 R Axis:   -2 Text Interpretation:  Normal sinus rhythm Normal ECG Confirmed by Shayma Pfefferle   MD, Mehlani Blankenburg ([id number]) on  09/15/2015 1:05:12 AM      MDM   Final diagnoses:  None    Patient is a 69 year old male with history of hypertension and diabetes. He presents after an episode of low blood pressure and low blood sugar that occurred at home. He had been drinking alcohol this evening and began to feel unwell. He checked his blood pressure and it was found to be low. He reports blood pressures ranging in the 60A to 540 systolic. His workup in the ER reveals no obvious abnormalities that would explain this. He has been normotensive throughout his emergency department stay and his orthostatic vital signs are normal after 1 L of normal saline. I see nothing that indicates admission and believe  is appropriate for discharge. To follow-up with his primary doctor and return if he worsens.  I personally performed the services described in this documentation, which was scribed in my presence. The recorded information has been reviewed and is accurate.      [name], MD 09/15/15 [phone]

## 2015-09-15 NOTE — ED Notes (Signed)
Orthostatics given to Dr. Stark Jock

## 2015-09-15 NOTE — Discharge Instructions (Signed)

## 2015-09-15 NOTE — ED Notes (Signed)
Dr. Delo at the bedside 

## 2015-11-26 DIAGNOSIS — M531 Cervicobrachial syndrome: Secondary | ICD-10-CM | POA: Diagnosis not present

## 2015-11-26 DIAGNOSIS — M9903 Segmental and somatic dysfunction of lumbar region: Secondary | ICD-10-CM | POA: Diagnosis not present

## 2015-11-26 DIAGNOSIS — M9901 Segmental and somatic dysfunction of cervical region: Secondary | ICD-10-CM | POA: Diagnosis not present

## 2015-11-26 DIAGNOSIS — M9902 Segmental and somatic dysfunction of thoracic region: Secondary | ICD-10-CM | POA: Diagnosis not present

## 2015-11-29 DIAGNOSIS — M9903 Segmental and somatic dysfunction of lumbar region: Secondary | ICD-10-CM | POA: Diagnosis not present

## 2015-11-29 DIAGNOSIS — M531 Cervicobrachial syndrome: Secondary | ICD-10-CM | POA: Diagnosis not present

## 2015-11-29 DIAGNOSIS — M9902 Segmental and somatic dysfunction of thoracic region: Secondary | ICD-10-CM | POA: Diagnosis not present

## 2015-11-29 DIAGNOSIS — M9901 Segmental and somatic dysfunction of cervical region: Secondary | ICD-10-CM | POA: Diagnosis not present

## 2016-02-06 DIAGNOSIS — M9903 Segmental and somatic dysfunction of lumbar region: Secondary | ICD-10-CM | POA: Diagnosis not present

## 2016-02-06 DIAGNOSIS — M5030 Other cervical disc degeneration, unspecified cervical region: Secondary | ICD-10-CM | POA: Diagnosis not present

## 2016-02-06 DIAGNOSIS — M9901 Segmental and somatic dysfunction of cervical region: Secondary | ICD-10-CM | POA: Diagnosis not present

## 2016-02-06 DIAGNOSIS — M9904 Segmental and somatic dysfunction of sacral region: Secondary | ICD-10-CM | POA: Diagnosis not present

## 2016-02-06 DIAGNOSIS — M9905 Segmental and somatic dysfunction of pelvic region: Secondary | ICD-10-CM | POA: Diagnosis not present

## 2016-02-06 DIAGNOSIS — M9902 Segmental and somatic dysfunction of thoracic region: Secondary | ICD-10-CM | POA: Diagnosis not present

## 2016-02-19 DIAGNOSIS — M9904 Segmental and somatic dysfunction of sacral region: Secondary | ICD-10-CM | POA: Diagnosis not present

## 2016-02-19 DIAGNOSIS — Z6838 Body mass index (BMI) 38.0-38.9, adult: Secondary | ICD-10-CM | POA: Diagnosis not present

## 2016-02-19 DIAGNOSIS — E1165 Type 2 diabetes mellitus with hyperglycemia: Secondary | ICD-10-CM | POA: Diagnosis not present

## 2016-02-19 DIAGNOSIS — M9903 Segmental and somatic dysfunction of lumbar region: Secondary | ICD-10-CM | POA: Diagnosis not present

## 2016-02-19 DIAGNOSIS — M5030 Other cervical disc degeneration, unspecified cervical region: Secondary | ICD-10-CM | POA: Diagnosis not present

## 2016-02-19 DIAGNOSIS — M9902 Segmental and somatic dysfunction of thoracic region: Secondary | ICD-10-CM | POA: Diagnosis not present

## 2016-02-19 DIAGNOSIS — G4733 Obstructive sleep apnea (adult) (pediatric): Secondary | ICD-10-CM | POA: Diagnosis not present

## 2016-02-19 DIAGNOSIS — I10 Essential (primary) hypertension: Secondary | ICD-10-CM | POA: Diagnosis not present

## 2016-02-19 DIAGNOSIS — M9901 Segmental and somatic dysfunction of cervical region: Secondary | ICD-10-CM | POA: Diagnosis not present

## 2016-02-19 DIAGNOSIS — E784 Other hyperlipidemia: Secondary | ICD-10-CM | POA: Diagnosis not present

## 2016-02-19 DIAGNOSIS — M9905 Segmental and somatic dysfunction of pelvic region: Secondary | ICD-10-CM | POA: Diagnosis not present

## 2016-03-26 DIAGNOSIS — M9903 Segmental and somatic dysfunction of lumbar region: Secondary | ICD-10-CM | POA: Diagnosis not present

## 2016-03-26 DIAGNOSIS — M9905 Segmental and somatic dysfunction of pelvic region: Secondary | ICD-10-CM | POA: Diagnosis not present

## 2016-03-26 DIAGNOSIS — M9901 Segmental and somatic dysfunction of cervical region: Secondary | ICD-10-CM | POA: Diagnosis not present

## 2016-03-26 DIAGNOSIS — M9902 Segmental and somatic dysfunction of thoracic region: Secondary | ICD-10-CM | POA: Diagnosis not present

## 2016-03-26 DIAGNOSIS — M9904 Segmental and somatic dysfunction of sacral region: Secondary | ICD-10-CM | POA: Diagnosis not present

## 2016-03-26 DIAGNOSIS — M5030 Other cervical disc degeneration, unspecified cervical region: Secondary | ICD-10-CM | POA: Diagnosis not present

## 2016-03-31 DIAGNOSIS — M9905 Segmental and somatic dysfunction of pelvic region: Secondary | ICD-10-CM | POA: Diagnosis not present

## 2016-03-31 DIAGNOSIS — M5030 Other cervical disc degeneration, unspecified cervical region: Secondary | ICD-10-CM | POA: Diagnosis not present

## 2016-03-31 DIAGNOSIS — M9902 Segmental and somatic dysfunction of thoracic region: Secondary | ICD-10-CM | POA: Diagnosis not present

## 2016-03-31 DIAGNOSIS — M9904 Segmental and somatic dysfunction of sacral region: Secondary | ICD-10-CM | POA: Diagnosis not present

## 2016-03-31 DIAGNOSIS — M9901 Segmental and somatic dysfunction of cervical region: Secondary | ICD-10-CM | POA: Diagnosis not present

## 2016-03-31 DIAGNOSIS — M9903 Segmental and somatic dysfunction of lumbar region: Secondary | ICD-10-CM | POA: Diagnosis not present

## 2016-04-07 DIAGNOSIS — M5030 Other cervical disc degeneration, unspecified cervical region: Secondary | ICD-10-CM | POA: Diagnosis not present

## 2016-04-07 DIAGNOSIS — M9903 Segmental and somatic dysfunction of lumbar region: Secondary | ICD-10-CM | POA: Diagnosis not present

## 2016-04-07 DIAGNOSIS — M9901 Segmental and somatic dysfunction of cervical region: Secondary | ICD-10-CM | POA: Diagnosis not present

## 2016-04-07 DIAGNOSIS — M9902 Segmental and somatic dysfunction of thoracic region: Secondary | ICD-10-CM | POA: Diagnosis not present

## 2016-04-07 DIAGNOSIS — M9905 Segmental and somatic dysfunction of pelvic region: Secondary | ICD-10-CM | POA: Diagnosis not present

## 2016-04-07 DIAGNOSIS — M9904 Segmental and somatic dysfunction of sacral region: Secondary | ICD-10-CM | POA: Diagnosis not present

## 2016-06-30 DIAGNOSIS — E1140 Type 2 diabetes mellitus with diabetic neuropathy, unspecified: Secondary | ICD-10-CM | POA: Diagnosis not present

## 2016-06-30 DIAGNOSIS — G6289 Other specified polyneuropathies: Secondary | ICD-10-CM | POA: Diagnosis not present

## 2016-06-30 DIAGNOSIS — I10 Essential (primary) hypertension: Secondary | ICD-10-CM | POA: Diagnosis not present

## 2016-06-30 DIAGNOSIS — R5383 Other fatigue: Secondary | ICD-10-CM | POA: Diagnosis not present

## 2016-06-30 DIAGNOSIS — Z6841 Body Mass Index (BMI) 40.0 and over, adult: Secondary | ICD-10-CM | POA: Diagnosis not present

## 2016-06-30 DIAGNOSIS — E784 Other hyperlipidemia: Secondary | ICD-10-CM | POA: Diagnosis not present

## 2016-11-12 DIAGNOSIS — I10 Essential (primary) hypertension: Secondary | ICD-10-CM | POA: Diagnosis not present

## 2016-11-12 DIAGNOSIS — E1140 Type 2 diabetes mellitus with diabetic neuropathy, unspecified: Secondary | ICD-10-CM | POA: Diagnosis not present

## 2016-11-12 DIAGNOSIS — E784 Other hyperlipidemia: Secondary | ICD-10-CM | POA: Diagnosis not present

## 2016-11-12 DIAGNOSIS — Z125 Encounter for screening for malignant neoplasm of prostate: Secondary | ICD-10-CM | POA: Diagnosis not present

## 2016-11-18 DIAGNOSIS — Z1389 Encounter for screening for other disorder: Secondary | ICD-10-CM | POA: Diagnosis not present

## 2016-11-18 DIAGNOSIS — M1990 Unspecified osteoarthritis, unspecified site: Secondary | ICD-10-CM | POA: Diagnosis not present

## 2016-11-18 DIAGNOSIS — Z23 Encounter for immunization: Secondary | ICD-10-CM | POA: Diagnosis not present

## 2016-11-18 DIAGNOSIS — N400 Enlarged prostate without lower urinary tract symptoms: Secondary | ICD-10-CM | POA: Diagnosis not present

## 2016-11-18 DIAGNOSIS — R350 Frequency of micturition: Secondary | ICD-10-CM | POA: Diagnosis not present

## 2016-11-18 DIAGNOSIS — Z0000 Encounter for general adult medical examination without abnormal findings: Secondary | ICD-10-CM | POA: Diagnosis not present

## 2016-11-18 DIAGNOSIS — E1140 Type 2 diabetes mellitus with diabetic neuropathy, unspecified: Secondary | ICD-10-CM | POA: Diagnosis not present

## 2016-11-18 DIAGNOSIS — Z77090 Contact with and (suspected) exposure to asbestos: Secondary | ICD-10-CM | POA: Diagnosis not present

## 2016-11-18 DIAGNOSIS — N528 Other male erectile dysfunction: Secondary | ICD-10-CM | POA: Diagnosis not present

## 2016-11-18 DIAGNOSIS — R5383 Other fatigue: Secondary | ICD-10-CM | POA: Diagnosis not present

## 2016-11-18 DIAGNOSIS — K429 Umbilical hernia without obstruction or gangrene: Secondary | ICD-10-CM | POA: Diagnosis not present

## 2016-11-18 DIAGNOSIS — E668 Other obesity: Secondary | ICD-10-CM | POA: Diagnosis not present

## 2016-11-18 DIAGNOSIS — Z6841 Body Mass Index (BMI) 40.0 and over, adult: Secondary | ICD-10-CM | POA: Diagnosis not present

## 2016-11-24 DIAGNOSIS — M9904 Segmental and somatic dysfunction of sacral region: Secondary | ICD-10-CM | POA: Diagnosis not present

## 2016-11-24 DIAGNOSIS — M9903 Segmental and somatic dysfunction of lumbar region: Secondary | ICD-10-CM | POA: Diagnosis not present

## 2016-11-24 DIAGNOSIS — M9901 Segmental and somatic dysfunction of cervical region: Secondary | ICD-10-CM | POA: Diagnosis not present

## 2016-11-24 DIAGNOSIS — M5030 Other cervical disc degeneration, unspecified cervical region: Secondary | ICD-10-CM | POA: Diagnosis not present

## 2016-11-24 DIAGNOSIS — M9905 Segmental and somatic dysfunction of pelvic region: Secondary | ICD-10-CM | POA: Diagnosis not present

## 2016-11-24 DIAGNOSIS — M9902 Segmental and somatic dysfunction of thoracic region: Secondary | ICD-10-CM | POA: Diagnosis not present

## 2016-11-26 DIAGNOSIS — M9903 Segmental and somatic dysfunction of lumbar region: Secondary | ICD-10-CM | POA: Diagnosis not present

## 2016-11-26 DIAGNOSIS — M9902 Segmental and somatic dysfunction of thoracic region: Secondary | ICD-10-CM | POA: Diagnosis not present

## 2016-11-26 DIAGNOSIS — M9904 Segmental and somatic dysfunction of sacral region: Secondary | ICD-10-CM | POA: Diagnosis not present

## 2016-11-26 DIAGNOSIS — M9905 Segmental and somatic dysfunction of pelvic region: Secondary | ICD-10-CM | POA: Diagnosis not present

## 2016-11-26 DIAGNOSIS — M9901 Segmental and somatic dysfunction of cervical region: Secondary | ICD-10-CM | POA: Diagnosis not present

## 2016-11-26 DIAGNOSIS — M5030 Other cervical disc degeneration, unspecified cervical region: Secondary | ICD-10-CM | POA: Diagnosis not present

## 2016-11-27 DIAGNOSIS — M9905 Segmental and somatic dysfunction of pelvic region: Secondary | ICD-10-CM | POA: Diagnosis not present

## 2016-11-27 DIAGNOSIS — Z1212 Encounter for screening for malignant neoplasm of rectum: Secondary | ICD-10-CM | POA: Diagnosis not present

## 2016-11-27 DIAGNOSIS — M9903 Segmental and somatic dysfunction of lumbar region: Secondary | ICD-10-CM | POA: Diagnosis not present

## 2016-11-27 DIAGNOSIS — M9902 Segmental and somatic dysfunction of thoracic region: Secondary | ICD-10-CM | POA: Diagnosis not present

## 2016-11-27 DIAGNOSIS — M5030 Other cervical disc degeneration, unspecified cervical region: Secondary | ICD-10-CM | POA: Diagnosis not present

## 2016-11-27 DIAGNOSIS — M9904 Segmental and somatic dysfunction of sacral region: Secondary | ICD-10-CM | POA: Diagnosis not present

## 2016-11-27 DIAGNOSIS — M9901 Segmental and somatic dysfunction of cervical region: Secondary | ICD-10-CM | POA: Diagnosis not present

## 2016-12-01 DIAGNOSIS — M9904 Segmental and somatic dysfunction of sacral region: Secondary | ICD-10-CM | POA: Diagnosis not present

## 2016-12-01 DIAGNOSIS — M9905 Segmental and somatic dysfunction of pelvic region: Secondary | ICD-10-CM | POA: Diagnosis not present

## 2016-12-01 DIAGNOSIS — M9901 Segmental and somatic dysfunction of cervical region: Secondary | ICD-10-CM | POA: Diagnosis not present

## 2016-12-01 DIAGNOSIS — M9903 Segmental and somatic dysfunction of lumbar region: Secondary | ICD-10-CM | POA: Diagnosis not present

## 2016-12-01 DIAGNOSIS — M9902 Segmental and somatic dysfunction of thoracic region: Secondary | ICD-10-CM | POA: Diagnosis not present

## 2016-12-01 DIAGNOSIS — M5030 Other cervical disc degeneration, unspecified cervical region: Secondary | ICD-10-CM | POA: Diagnosis not present

## 2016-12-08 DIAGNOSIS — M9901 Segmental and somatic dysfunction of cervical region: Secondary | ICD-10-CM | POA: Diagnosis not present

## 2016-12-08 DIAGNOSIS — M9905 Segmental and somatic dysfunction of pelvic region: Secondary | ICD-10-CM | POA: Diagnosis not present

## 2016-12-08 DIAGNOSIS — M9902 Segmental and somatic dysfunction of thoracic region: Secondary | ICD-10-CM | POA: Diagnosis not present

## 2016-12-08 DIAGNOSIS — M5030 Other cervical disc degeneration, unspecified cervical region: Secondary | ICD-10-CM | POA: Diagnosis not present

## 2016-12-08 DIAGNOSIS — M9903 Segmental and somatic dysfunction of lumbar region: Secondary | ICD-10-CM | POA: Diagnosis not present

## 2016-12-08 DIAGNOSIS — M9904 Segmental and somatic dysfunction of sacral region: Secondary | ICD-10-CM | POA: Diagnosis not present

## 2016-12-10 DIAGNOSIS — M9903 Segmental and somatic dysfunction of lumbar region: Secondary | ICD-10-CM | POA: Diagnosis not present

## 2016-12-10 DIAGNOSIS — M9902 Segmental and somatic dysfunction of thoracic region: Secondary | ICD-10-CM | POA: Diagnosis not present

## 2016-12-10 DIAGNOSIS — M9901 Segmental and somatic dysfunction of cervical region: Secondary | ICD-10-CM | POA: Diagnosis not present

## 2016-12-10 DIAGNOSIS — M9905 Segmental and somatic dysfunction of pelvic region: Secondary | ICD-10-CM | POA: Diagnosis not present

## 2016-12-10 DIAGNOSIS — M5030 Other cervical disc degeneration, unspecified cervical region: Secondary | ICD-10-CM | POA: Diagnosis not present

## 2016-12-10 DIAGNOSIS — M9904 Segmental and somatic dysfunction of sacral region: Secondary | ICD-10-CM | POA: Diagnosis not present

## 2016-12-11 DIAGNOSIS — M5030 Other cervical disc degeneration, unspecified cervical region: Secondary | ICD-10-CM | POA: Diagnosis not present

## 2016-12-11 DIAGNOSIS — M9903 Segmental and somatic dysfunction of lumbar region: Secondary | ICD-10-CM | POA: Diagnosis not present

## 2016-12-11 DIAGNOSIS — M9905 Segmental and somatic dysfunction of pelvic region: Secondary | ICD-10-CM | POA: Diagnosis not present

## 2016-12-11 DIAGNOSIS — M9901 Segmental and somatic dysfunction of cervical region: Secondary | ICD-10-CM | POA: Diagnosis not present

## 2016-12-11 DIAGNOSIS — M9904 Segmental and somatic dysfunction of sacral region: Secondary | ICD-10-CM | POA: Diagnosis not present

## 2016-12-11 DIAGNOSIS — M9902 Segmental and somatic dysfunction of thoracic region: Secondary | ICD-10-CM | POA: Diagnosis not present

## 2016-12-15 DIAGNOSIS — M5030 Other cervical disc degeneration, unspecified cervical region: Secondary | ICD-10-CM | POA: Diagnosis not present

## 2016-12-15 DIAGNOSIS — M9901 Segmental and somatic dysfunction of cervical region: Secondary | ICD-10-CM | POA: Diagnosis not present

## 2016-12-15 DIAGNOSIS — M9902 Segmental and somatic dysfunction of thoracic region: Secondary | ICD-10-CM | POA: Diagnosis not present

## 2016-12-15 DIAGNOSIS — M9905 Segmental and somatic dysfunction of pelvic region: Secondary | ICD-10-CM | POA: Diagnosis not present

## 2016-12-15 DIAGNOSIS — M9904 Segmental and somatic dysfunction of sacral region: Secondary | ICD-10-CM | POA: Diagnosis not present

## 2016-12-15 DIAGNOSIS — M9903 Segmental and somatic dysfunction of lumbar region: Secondary | ICD-10-CM | POA: Diagnosis not present

## 2016-12-17 DIAGNOSIS — M9905 Segmental and somatic dysfunction of pelvic region: Secondary | ICD-10-CM | POA: Diagnosis not present

## 2016-12-17 DIAGNOSIS — M9901 Segmental and somatic dysfunction of cervical region: Secondary | ICD-10-CM | POA: Diagnosis not present

## 2016-12-17 DIAGNOSIS — M9902 Segmental and somatic dysfunction of thoracic region: Secondary | ICD-10-CM | POA: Diagnosis not present

## 2016-12-17 DIAGNOSIS — M9904 Segmental and somatic dysfunction of sacral region: Secondary | ICD-10-CM | POA: Diagnosis not present

## 2016-12-17 DIAGNOSIS — M5030 Other cervical disc degeneration, unspecified cervical region: Secondary | ICD-10-CM | POA: Diagnosis not present

## 2016-12-17 DIAGNOSIS — M9903 Segmental and somatic dysfunction of lumbar region: Secondary | ICD-10-CM | POA: Diagnosis not present

## 2016-12-22 DIAGNOSIS — M9901 Segmental and somatic dysfunction of cervical region: Secondary | ICD-10-CM | POA: Diagnosis not present

## 2016-12-22 DIAGNOSIS — M9904 Segmental and somatic dysfunction of sacral region: Secondary | ICD-10-CM | POA: Diagnosis not present

## 2016-12-22 DIAGNOSIS — M9905 Segmental and somatic dysfunction of pelvic region: Secondary | ICD-10-CM | POA: Diagnosis not present

## 2016-12-22 DIAGNOSIS — M9903 Segmental and somatic dysfunction of lumbar region: Secondary | ICD-10-CM | POA: Diagnosis not present

## 2016-12-22 DIAGNOSIS — M9902 Segmental and somatic dysfunction of thoracic region: Secondary | ICD-10-CM | POA: Diagnosis not present

## 2016-12-22 DIAGNOSIS — M5030 Other cervical disc degeneration, unspecified cervical region: Secondary | ICD-10-CM | POA: Diagnosis not present

## 2016-12-24 DIAGNOSIS — M5030 Other cervical disc degeneration, unspecified cervical region: Secondary | ICD-10-CM | POA: Diagnosis not present

## 2016-12-24 DIAGNOSIS — M9901 Segmental and somatic dysfunction of cervical region: Secondary | ICD-10-CM | POA: Diagnosis not present

## 2016-12-24 DIAGNOSIS — M9902 Segmental and somatic dysfunction of thoracic region: Secondary | ICD-10-CM | POA: Diagnosis not present

## 2016-12-24 DIAGNOSIS — M9903 Segmental and somatic dysfunction of lumbar region: Secondary | ICD-10-CM | POA: Diagnosis not present

## 2016-12-24 DIAGNOSIS — M9905 Segmental and somatic dysfunction of pelvic region: Secondary | ICD-10-CM | POA: Diagnosis not present

## 2016-12-24 DIAGNOSIS — M9904 Segmental and somatic dysfunction of sacral region: Secondary | ICD-10-CM | POA: Diagnosis not present

## 2016-12-25 DIAGNOSIS — M9901 Segmental and somatic dysfunction of cervical region: Secondary | ICD-10-CM | POA: Diagnosis not present

## 2016-12-25 DIAGNOSIS — M9903 Segmental and somatic dysfunction of lumbar region: Secondary | ICD-10-CM | POA: Diagnosis not present

## 2016-12-25 DIAGNOSIS — M9902 Segmental and somatic dysfunction of thoracic region: Secondary | ICD-10-CM | POA: Diagnosis not present

## 2016-12-25 DIAGNOSIS — M9904 Segmental and somatic dysfunction of sacral region: Secondary | ICD-10-CM | POA: Diagnosis not present

## 2016-12-25 DIAGNOSIS — M9905 Segmental and somatic dysfunction of pelvic region: Secondary | ICD-10-CM | POA: Diagnosis not present

## 2016-12-25 DIAGNOSIS — M5030 Other cervical disc degeneration, unspecified cervical region: Secondary | ICD-10-CM | POA: Diagnosis not present

## 2016-12-29 DIAGNOSIS — M5030 Other cervical disc degeneration, unspecified cervical region: Secondary | ICD-10-CM | POA: Diagnosis not present

## 2016-12-29 DIAGNOSIS — M9905 Segmental and somatic dysfunction of pelvic region: Secondary | ICD-10-CM | POA: Diagnosis not present

## 2016-12-29 DIAGNOSIS — M9901 Segmental and somatic dysfunction of cervical region: Secondary | ICD-10-CM | POA: Diagnosis not present

## 2016-12-29 DIAGNOSIS — M9903 Segmental and somatic dysfunction of lumbar region: Secondary | ICD-10-CM | POA: Diagnosis not present

## 2016-12-29 DIAGNOSIS — M9904 Segmental and somatic dysfunction of sacral region: Secondary | ICD-10-CM | POA: Diagnosis not present

## 2016-12-29 DIAGNOSIS — M9902 Segmental and somatic dysfunction of thoracic region: Secondary | ICD-10-CM | POA: Diagnosis not present

## 2016-12-31 DIAGNOSIS — M9902 Segmental and somatic dysfunction of thoracic region: Secondary | ICD-10-CM | POA: Diagnosis not present

## 2016-12-31 DIAGNOSIS — M9903 Segmental and somatic dysfunction of lumbar region: Secondary | ICD-10-CM | POA: Diagnosis not present

## 2016-12-31 DIAGNOSIS — M9905 Segmental and somatic dysfunction of pelvic region: Secondary | ICD-10-CM | POA: Diagnosis not present

## 2016-12-31 DIAGNOSIS — M9901 Segmental and somatic dysfunction of cervical region: Secondary | ICD-10-CM | POA: Diagnosis not present

## 2016-12-31 DIAGNOSIS — M9904 Segmental and somatic dysfunction of sacral region: Secondary | ICD-10-CM | POA: Diagnosis not present

## 2016-12-31 DIAGNOSIS — M5030 Other cervical disc degeneration, unspecified cervical region: Secondary | ICD-10-CM | POA: Diagnosis not present

## 2017-01-01 DIAGNOSIS — M9905 Segmental and somatic dysfunction of pelvic region: Secondary | ICD-10-CM | POA: Diagnosis not present

## 2017-01-01 DIAGNOSIS — M9904 Segmental and somatic dysfunction of sacral region: Secondary | ICD-10-CM | POA: Diagnosis not present

## 2017-01-01 DIAGNOSIS — M9901 Segmental and somatic dysfunction of cervical region: Secondary | ICD-10-CM | POA: Diagnosis not present

## 2017-01-01 DIAGNOSIS — M9902 Segmental and somatic dysfunction of thoracic region: Secondary | ICD-10-CM | POA: Diagnosis not present

## 2017-01-01 DIAGNOSIS — M9903 Segmental and somatic dysfunction of lumbar region: Secondary | ICD-10-CM | POA: Diagnosis not present

## 2017-01-01 DIAGNOSIS — M5030 Other cervical disc degeneration, unspecified cervical region: Secondary | ICD-10-CM | POA: Diagnosis not present

## 2017-01-05 DIAGNOSIS — M9903 Segmental and somatic dysfunction of lumbar region: Secondary | ICD-10-CM | POA: Diagnosis not present

## 2017-01-05 DIAGNOSIS — M9905 Segmental and somatic dysfunction of pelvic region: Secondary | ICD-10-CM | POA: Diagnosis not present

## 2017-01-05 DIAGNOSIS — M5030 Other cervical disc degeneration, unspecified cervical region: Secondary | ICD-10-CM | POA: Diagnosis not present

## 2017-01-05 DIAGNOSIS — M9904 Segmental and somatic dysfunction of sacral region: Secondary | ICD-10-CM | POA: Diagnosis not present

## 2017-01-05 DIAGNOSIS — M9901 Segmental and somatic dysfunction of cervical region: Secondary | ICD-10-CM | POA: Diagnosis not present

## 2017-01-05 DIAGNOSIS — M9902 Segmental and somatic dysfunction of thoracic region: Secondary | ICD-10-CM | POA: Diagnosis not present

## 2017-01-07 DIAGNOSIS — M9901 Segmental and somatic dysfunction of cervical region: Secondary | ICD-10-CM | POA: Diagnosis not present

## 2017-01-07 DIAGNOSIS — M5030 Other cervical disc degeneration, unspecified cervical region: Secondary | ICD-10-CM | POA: Diagnosis not present

## 2017-01-07 DIAGNOSIS — M9902 Segmental and somatic dysfunction of thoracic region: Secondary | ICD-10-CM | POA: Diagnosis not present

## 2017-01-07 DIAGNOSIS — M9903 Segmental and somatic dysfunction of lumbar region: Secondary | ICD-10-CM | POA: Diagnosis not present

## 2017-01-07 DIAGNOSIS — M9905 Segmental and somatic dysfunction of pelvic region: Secondary | ICD-10-CM | POA: Diagnosis not present

## 2017-01-07 DIAGNOSIS — M9904 Segmental and somatic dysfunction of sacral region: Secondary | ICD-10-CM | POA: Diagnosis not present

## 2017-01-08 DIAGNOSIS — M9905 Segmental and somatic dysfunction of pelvic region: Secondary | ICD-10-CM | POA: Diagnosis not present

## 2017-01-08 DIAGNOSIS — M9902 Segmental and somatic dysfunction of thoracic region: Secondary | ICD-10-CM | POA: Diagnosis not present

## 2017-01-08 DIAGNOSIS — M9901 Segmental and somatic dysfunction of cervical region: Secondary | ICD-10-CM | POA: Diagnosis not present

## 2017-01-08 DIAGNOSIS — M9904 Segmental and somatic dysfunction of sacral region: Secondary | ICD-10-CM | POA: Diagnosis not present

## 2017-01-08 DIAGNOSIS — M9903 Segmental and somatic dysfunction of lumbar region: Secondary | ICD-10-CM | POA: Diagnosis not present

## 2017-01-08 DIAGNOSIS — M5030 Other cervical disc degeneration, unspecified cervical region: Secondary | ICD-10-CM | POA: Diagnosis not present

## 2017-01-12 DIAGNOSIS — M5030 Other cervical disc degeneration, unspecified cervical region: Secondary | ICD-10-CM | POA: Diagnosis not present

## 2017-01-12 DIAGNOSIS — M9905 Segmental and somatic dysfunction of pelvic region: Secondary | ICD-10-CM | POA: Diagnosis not present

## 2017-01-12 DIAGNOSIS — M9904 Segmental and somatic dysfunction of sacral region: Secondary | ICD-10-CM | POA: Diagnosis not present

## 2017-01-12 DIAGNOSIS — M9903 Segmental and somatic dysfunction of lumbar region: Secondary | ICD-10-CM | POA: Diagnosis not present

## 2017-01-12 DIAGNOSIS — M9902 Segmental and somatic dysfunction of thoracic region: Secondary | ICD-10-CM | POA: Diagnosis not present

## 2017-01-12 DIAGNOSIS — M9901 Segmental and somatic dysfunction of cervical region: Secondary | ICD-10-CM | POA: Diagnosis not present

## 2017-01-14 DIAGNOSIS — M5030 Other cervical disc degeneration, unspecified cervical region: Secondary | ICD-10-CM | POA: Diagnosis not present

## 2017-01-14 DIAGNOSIS — M9905 Segmental and somatic dysfunction of pelvic region: Secondary | ICD-10-CM | POA: Diagnosis not present

## 2017-01-14 DIAGNOSIS — M9903 Segmental and somatic dysfunction of lumbar region: Secondary | ICD-10-CM | POA: Diagnosis not present

## 2017-01-14 DIAGNOSIS — M9902 Segmental and somatic dysfunction of thoracic region: Secondary | ICD-10-CM | POA: Diagnosis not present

## 2017-01-14 DIAGNOSIS — M9904 Segmental and somatic dysfunction of sacral region: Secondary | ICD-10-CM | POA: Diagnosis not present

## 2017-01-14 DIAGNOSIS — M9901 Segmental and somatic dysfunction of cervical region: Secondary | ICD-10-CM | POA: Diagnosis not present

## 2017-03-03 DIAGNOSIS — G6289 Other specified polyneuropathies: Secondary | ICD-10-CM | POA: Diagnosis not present

## 2017-03-03 DIAGNOSIS — Z6841 Body Mass Index (BMI) 40.0 and over, adult: Secondary | ICD-10-CM | POA: Diagnosis not present

## 2017-03-03 DIAGNOSIS — I10 Essential (primary) hypertension: Secondary | ICD-10-CM | POA: Diagnosis not present

## 2017-03-03 DIAGNOSIS — K219 Gastro-esophageal reflux disease without esophagitis: Secondary | ICD-10-CM | POA: Diagnosis not present

## 2017-03-03 DIAGNOSIS — Z1389 Encounter for screening for other disorder: Secondary | ICD-10-CM | POA: Diagnosis not present

## 2017-03-03 DIAGNOSIS — E1140 Type 2 diabetes mellitus with diabetic neuropathy, unspecified: Secondary | ICD-10-CM | POA: Diagnosis not present

## 2017-03-03 DIAGNOSIS — M1990 Unspecified osteoarthritis, unspecified site: Secondary | ICD-10-CM | POA: Diagnosis not present

## 2017-04-14 DIAGNOSIS — Z6841 Body Mass Index (BMI) 40.0 and over, adult: Secondary | ICD-10-CM | POA: Diagnosis not present

## 2017-04-14 DIAGNOSIS — E1140 Type 2 diabetes mellitus with diabetic neuropathy, unspecified: Secondary | ICD-10-CM | POA: Diagnosis not present

## 2017-04-14 DIAGNOSIS — L0889 Other specified local infections of the skin and subcutaneous tissue: Secondary | ICD-10-CM | POA: Diagnosis not present

## 2017-07-02 DIAGNOSIS — E784 Other hyperlipidemia: Secondary | ICD-10-CM | POA: Diagnosis not present

## 2017-07-02 DIAGNOSIS — G6289 Other specified polyneuropathies: Secondary | ICD-10-CM | POA: Diagnosis not present

## 2017-07-02 DIAGNOSIS — Z6841 Body Mass Index (BMI) 40.0 and over, adult: Secondary | ICD-10-CM | POA: Diagnosis not present

## 2017-07-02 DIAGNOSIS — I10 Essential (primary) hypertension: Secondary | ICD-10-CM | POA: Diagnosis not present

## 2017-07-02 DIAGNOSIS — E1140 Type 2 diabetes mellitus with diabetic neuropathy, unspecified: Secondary | ICD-10-CM | POA: Diagnosis not present

## 2017-11-16 DIAGNOSIS — Z125 Encounter for screening for malignant neoplasm of prostate: Secondary | ICD-10-CM | POA: Diagnosis not present

## 2017-11-16 DIAGNOSIS — E1165 Type 2 diabetes mellitus with hyperglycemia: Secondary | ICD-10-CM | POA: Diagnosis not present

## 2017-11-16 DIAGNOSIS — E7849 Other hyperlipidemia: Secondary | ICD-10-CM | POA: Diagnosis not present

## 2017-11-16 DIAGNOSIS — R82998 Other abnormal findings in urine: Secondary | ICD-10-CM | POA: Diagnosis not present

## 2017-11-16 DIAGNOSIS — I10 Essential (primary) hypertension: Secondary | ICD-10-CM | POA: Diagnosis not present

## 2017-11-23 DIAGNOSIS — R5383 Other fatigue: Secondary | ICD-10-CM | POA: Diagnosis not present

## 2017-11-23 DIAGNOSIS — Z1389 Encounter for screening for other disorder: Secondary | ICD-10-CM | POA: Diagnosis not present

## 2017-11-23 DIAGNOSIS — M1990 Unspecified osteoarthritis, unspecified site: Secondary | ICD-10-CM | POA: Diagnosis not present

## 2017-11-23 DIAGNOSIS — Z77090 Contact with and (suspected) exposure to asbestos: Secondary | ICD-10-CM | POA: Diagnosis not present

## 2017-11-23 DIAGNOSIS — G6289 Other specified polyneuropathies: Secondary | ICD-10-CM | POA: Diagnosis not present

## 2017-11-23 DIAGNOSIS — E1140 Type 2 diabetes mellitus with diabetic neuropathy, unspecified: Secondary | ICD-10-CM | POA: Diagnosis not present

## 2017-11-23 DIAGNOSIS — L0889 Other specified local infections of the skin and subcutaneous tissue: Secondary | ICD-10-CM | POA: Diagnosis not present

## 2017-11-23 DIAGNOSIS — N528 Other male erectile dysfunction: Secondary | ICD-10-CM | POA: Diagnosis not present

## 2017-11-23 DIAGNOSIS — Z0000 Encounter for general adult medical examination without abnormal findings: Secondary | ICD-10-CM | POA: Diagnosis not present

## 2017-11-23 DIAGNOSIS — N400 Enlarged prostate without lower urinary tract symptoms: Secondary | ICD-10-CM | POA: Diagnosis not present

## 2017-11-23 DIAGNOSIS — K429 Umbilical hernia without obstruction or gangrene: Secondary | ICD-10-CM | POA: Diagnosis not present

## 2017-11-23 DIAGNOSIS — Z6841 Body Mass Index (BMI) 40.0 and over, adult: Secondary | ICD-10-CM | POA: Diagnosis not present

## 2017-11-24 DIAGNOSIS — E7849 Other hyperlipidemia: Secondary | ICD-10-CM | POA: Diagnosis not present

## 2017-11-24 DIAGNOSIS — G4733 Obstructive sleep apnea (adult) (pediatric): Secondary | ICD-10-CM | POA: Diagnosis not present

## 2017-11-24 DIAGNOSIS — E119 Type 2 diabetes mellitus without complications: Secondary | ICD-10-CM | POA: Diagnosis not present

## 2017-11-24 DIAGNOSIS — I10 Essential (primary) hypertension: Secondary | ICD-10-CM | POA: Diagnosis not present

## 2017-11-24 DIAGNOSIS — R0602 Shortness of breath: Secondary | ICD-10-CM | POA: Diagnosis not present

## 2017-11-26 DIAGNOSIS — Z1212 Encounter for screening for malignant neoplasm of rectum: Secondary | ICD-10-CM | POA: Diagnosis not present

## 2017-11-26 LAB — IFOBT (OCCULT BLOOD): IFOBT: POSITIVE

## 2017-12-07 DIAGNOSIS — R0602 Shortness of breath: Secondary | ICD-10-CM | POA: Diagnosis not present

## 2017-12-08 DIAGNOSIS — R0600 Dyspnea, unspecified: Secondary | ICD-10-CM | POA: Diagnosis not present

## 2018-01-05 ENCOUNTER — Encounter: Payer: Self-pay | Admitting: Gastroenterology

## 2018-01-05 ENCOUNTER — Ambulatory Visit (INDEPENDENT_AMBULATORY_CARE_PROVIDER_SITE_OTHER): Payer: PPO | Admitting: Gastroenterology

## 2018-01-05 VITALS — BP 120/78 | HR 74

## 2018-01-05 DIAGNOSIS — Z86010 Personal history of colonic polyps: Secondary | ICD-10-CM

## 2018-01-05 DIAGNOSIS — R195 Other fecal abnormalities: Secondary | ICD-10-CM

## 2018-01-05 MED ORDER — NA SULFATE-K SULFATE-MG SULF 17.5-3.13-1.6 GM/177ML PO SOLN: 1.0000 | Freq: Once | ORAL | 0 refills | Status: AC

## 2018-01-05 NOTE — Patient Instructions (Signed)
You have been scheduled for a colonoscopy. Please follow written instructions given to you at your visit today.  Please pick up your prep supplies at the pharmacy within the next 1-3 days. If you use inhalers (even only as needed), please bring them with you on the day of your procedure. Your physician has requested that you go to www.startemmi.com and enter the access code given to you at your visit today. This web site gives a general overview about your procedure. However, you should still follow specific instructions given to you by our office regarding your preparation for the procedure.  Normal BMI (Body Mass Index- based on height and weight) is between 23 and 30. Your BMI today is There is no height or weight on file to calculate BMI. . Please consider follow up  regarding your BMI with your Primary Care Provider.  Thank you for choosing me and [facility].  Malcolm T. Stark, Jr., MD., FACG  

## 2018-01-05 NOTE — Progress Notes (Addendum)
History of Present Illness: This is a 72 year old male referred by Crist Infante, MD for the evaluation of Hemosure positive stool.  He previously underwent colonoscopy in February 2015 showing adenomatous colon polyps and internal hemorrhoids.  A 5-year interval surveillance colonoscopy was recommended.  Hemosure positive stool was noted during routine physical examination.  He has no gastrointestinal complaints. Denies weight loss, abdominal pain, constipation, diarrhea, change in stool caliber, melena, hematochezia, nausea, vomiting, dysphagia, reflux symptoms, chest pain.   No Known Allergies Outpatient Medications Prior to Visit  Medication Sig Dispense Refill  . aspirin 81 MG tablet Take 81 mg by mouth daily.    Marland Kitchen atorvastatin (LIPITOR) 80 MG tablet Take 80 mg by mouth daily.    . carvedilol (COREG) 6.25 MG tablet Take 6.25 mg by mouth 2 (two) times daily with a meal.    . cholecalciferol (VITAMIN D) 1000 UNITS tablet Take 1,000 Units by mouth daily with breakfast.    . DULoxetine (CYMBALTA) 60 MG capsule Take 60 mg by mouth daily.    . Ezetimibe (ZETIA PO) Take 2.5 mg by mouth every other day.    . fish oil-omega-3 fatty acids 1000 MG capsule Take 1 g by mouth daily with breakfast.     . gabapentin (NEURONTIN) 300 MG capsule Take 600 mg by mouth 3 (three) times daily.     Marland Kitchen glipiZIDE (GLUCOTROL XL) 2.5 MG 24 hr tablet Take 2.5 mg by mouth 2 (two) times daily.    . Glucosamine-Chondroitin (OSTEO BI-FLEX REGULAR STRENGTH PO) Take 1,000 mg by mouth.    Marland Kitchen lisinopril-hydrochlorothiazide (PRINZIDE,ZESTORETIC) 20-12.5 MG tablet Take 1 tablet by mouth daily.    . metFORMIN (GLUCOPHAGE) 500 MG tablet Take 1,000 mg by mouth 2 (two) times daily with a meal.    . omeprazole (PRILOSEC) 20 MG capsule Take 20 mg by mouth daily.    . prazosin (MINIPRESS) 5 MG capsule Take 5 mg by mouth at bedtime.    Marland Kitchen venlafaxine (EFFEXOR) 37.5 MG tablet Take 37.5 mg by mouth 2 (two) times daily.    . vitamin  B-12 (CYANOCOBALAMIN) 1000 MCG tablet Take 2,000 mcg by mouth daily with breakfast.     . canagliflozin (INVOKANA) 300 MG TABS tablet Take 300 mg by mouth daily before breakfast.    . fluticasone (FLONASE) 50 MCG/ACT nasal spray Place 2 sprays into the nose daily as needed for allergies. Pt takes as needed     No facility-administered medications prior to visit.    Past Medical History:  Diagnosis Date  . Anal fissure   . Arthritis    knees, right foot   . BPH (benign prostatic hypertrophy)   . Diverticulosis   . DJD (degenerative joint disease)   . ED (erectile dysfunction)   . GERD (gastroesophageal reflux disease)   . Heart murmur    pt states he " outgrew"   . Hyperlipidemia   . Hypertension   . Internal hemorrhoids   . Neuropathy of foot   . Sleep apnea    does not use machine , uses 02 2l/[place] at nite   . Tubular adenoma of colon 12/2013   Past Surgical History:  Procedure Laterality Date  . ANAL FISSURE REPAIR    . FOOT FRACTURE SURGERY    . JOINT REPLACEMENT     left knee replacement   . TOTAL KNEE ARTHROPLASTY  12/12/2011   Procedure: TOTAL KNEE ARTHROPLASTY;  Surgeon: Cynda Familia, MD;  Location: [facility];  Service:  Orthopedics;  Laterality: Right;   Social History   Socioeconomic History  . Marital status: Married    Spouse name: None  . Number of children: 3  . Years of education: None  . Highest education level: None  Social Needs  . Financial resource strain: None  . Food insecurity - worry: None  . Food insecurity - inability: None  . Transportation needs - medical: None  . Transportation needs - non-medical: None  Occupational History  . Occupation: Retired  Tobacco Use  . Smoking status: Former Smoker    Types: Cigarettes    Last attempt to quit: 11/17/1981    Years since quitting: 36.1  . Smokeless tobacco: Former Systems developer    Types: Snuff    Quit date: 11/17/2001  Substance and Sexual Activity  . Alcohol use: Yes    Alcohol/week: 4.8 oz     Types: 8 Cans of beer per week  . Drug use: No  . Sexual activity: None  Other Topics Concern  . None  Social History Narrative  . None   Family History  Problem Relation Age of Onset  . Heart disease Mother   . Heart disease Father   . Heart disease Brother   . Colon cancer Maternal Grandmother       Review of Systems: Pertinent positive and negative review of systems were noted in the above HPI section. All other review of systems were otherwise negative.    Physical Exam: General: Well developed, well nourished, no acute distress Head: Normocephalic and atraumatic Eyes:  sclerae anicteric, EOMI Ears: Normal auditory acuity Mouth: No deformity or lesions Neck: Supple, no masses or thyromegaly Lungs: Clear throughout to auscultation Heart: Regular rate and rhythm; no murmurs, rubs or bruits Abdomen: Soft, non tender and non distended. No masses, hepatosplenomegaly or hernias noted. Normal Bowel sounds Rectal: Deferred to colonoscopy Musculoskeletal: Symmetrical with no gross deformities  Skin: No lesions on visible extremities Pulses:  Normal pulses noted Extremities: No clubbing, cyanosis, edema or deformities noted Neurological: Alert oriented x 4, grossly nonfocal Cervical Nodes:  No significant cervical adenopathy Inguinal Nodes: No significant inguinal adenopathy Psychological:  Alert and cooperative. Normal mood and affect  Assessment and Recommendations:  1. Occult blood in stool. Personal history of adenomatous colon polyps.  It's been 4 years since his last colonoscopy.  Rule out colorectal neoplasms.  Schedule colonoscopy.  The risks (including bleeding, perforation, infection, missed lesions, medication reactions and possible hospitalization or surgery if complications occur), benefits, and alternatives to colonoscopy with possible biopsy and possible polypectomy were discussed with the patient and they consent to proceed.     cc: Crist Infante, MD [address] [place], [place] [address]

## 2018-01-06 ENCOUNTER — Encounter: Payer: Self-pay | Admitting: Gastroenterology

## 2018-01-13 ENCOUNTER — Ambulatory Visit (AMBULATORY_SURGERY_CENTER): Payer: PPO | Admitting: Gastroenterology

## 2018-01-13 ENCOUNTER — Encounter: Payer: Self-pay | Admitting: Gastroenterology

## 2018-01-13 ENCOUNTER — Other Ambulatory Visit: Payer: Self-pay

## 2018-01-13 VITALS — BP 150/72 | HR 53 | Temp 98.4°F | Resp 11 | Ht 68.0 in | Wt 250.0 lb

## 2018-01-13 DIAGNOSIS — R195 Other fecal abnormalities: Secondary | ICD-10-CM

## 2018-01-13 DIAGNOSIS — Z86010 Personal history of colonic polyps: Secondary | ICD-10-CM

## 2018-01-13 DIAGNOSIS — D123 Benign neoplasm of transverse colon: Secondary | ICD-10-CM | POA: Diagnosis not present

## 2018-01-13 DIAGNOSIS — D124 Benign neoplasm of descending colon: Secondary | ICD-10-CM | POA: Diagnosis not present

## 2018-01-13 MED ORDER — SODIUM CHLORIDE 0.9 % IV SOLN: 500.0000 mL | Freq: Once | INTRAVENOUS | Status: DC

## 2018-01-13 NOTE — Progress Notes (Signed)
Called to room to assist during endoscopic procedure.  Patient ID and intended procedure confirmed with present staff. Received instructions for my participation in the procedure from the performing physician.  

## 2018-01-13 NOTE — Patient Instructions (Signed)
Handouts given : polyps, Diverticulosis, Hemorrhoids.  YOU HAD AN ENDOSCOPIC PROCEDURE TODAY AT [facility]:   Refer to the procedure report that was given to you for any specific questions about what was found during the examination.  If the procedure report does not answer your questions, please call your gastroenterologist to clarify.  If you requested that your care partner not be given the details of your procedure findings, then the procedure report has been included in a sealed envelope for you to review at your convenience later.  YOU SHOULD EXPECT: Some feelings of bloating in the abdomen. Passage of more gas than usual.  Walking can help get rid of the air that was put into your GI tract during the procedure and reduce the bloating. If you had a lower endoscopy (such as a colonoscopy or flexible sigmoidoscopy) you may notice spotting of blood in your stool or on the toilet paper. If you underwent a bowel prep for your procedure, you may not have a normal bowel movement for a few days.  Please Note:  You might notice some irritation and congestion in your nose or some drainage.  This is from the oxygen used during your procedure.  There is no need for concern and it should clear up in a day or so.  SYMPTOMS TO REPORT IMMEDIATELY:   Following lower endoscopy (colonoscopy or flexible sigmoidoscopy):  Excessive amounts of blood in the stool  Significant tenderness or worsening of abdominal pains  Swelling of the abdomen that is new, acute  Fever of 100F or higher   For urgent or emergent issues, a gastroenterologist can be reached at any hour by calling [phone].   DIET:  We do recommend a small meal at first, but then you may proceed to your regular diet.  Drink plenty of fluids but you should avoid alcoholic beverages for 24 hours.  ACTIVITY:  You should plan to take it easy for the rest of today and you should NOT DRIVE or use heavy machinery until tomorrow  (because of the sedation medicines used during the test).    FOLLOW UP: Our staff will call the number listed on your records the next business day following your procedure to check on you and address any questions or concerns that you may have regarding the information given to you following your procedure. If we do not reach you, we will leave a message.  However, if you are feeling well and you are not experiencing any problems, there is no need to return our call.  We will assume that you have returned to your regular daily activities without incident.  If any biopsies were taken you will be contacted by phone or by letter within the next 1-3 weeks.  Please call us at [phone] if you have not heard about the biopsies in 3 weeks.    SIGNATURES/CONFIDENTIALITY: You and/or your care partner have signed paperwork which will be entered into your electronic medical record.  These signatures attest to the fact that that the information above on your After Visit Summary has been reviewed and is understood.  Full responsibility of the confidentiality of this discharge information lies with you and/or your care-partner.

## 2018-01-13 NOTE — Progress Notes (Signed)
To PACU, VSS. Report to [email or web address] 

## 2018-01-13 NOTE — Op Note (Signed)
[place] Patient Name: [name] Procedure Date: 01/13/2018 2:16 PM MRN: [id number] Endoscopist: Ladene Artist , MD Age: 72 Referring MD:  Date of Birth: 03/03/1946 Gender: Male Account #: [id number] Procedure:                Colonoscopy Indications:              Positive fecal immunochemical test. Personal                            history of adenomatous colon polyps. Medicines:                Monitored Anesthesia Care Procedure:                Pre-Anesthesia Assessment:                           - Prior to the procedure, a History and Physical                            was performed, and patient medications and                            allergies were reviewed. The patient's tolerance of                            previous anesthesia was also reviewed. The risks                            and benefits of the procedure and the sedation                            options and risks were discussed with the patient.                            All questions were answered, and informed consent                            was obtained. Prior Anticoagulants: The patient has                            taken no previous anticoagulant or antiplatelet                            agents. ASA Grade Assessment: II - A patient with                            mild systemic disease. After reviewing the risks                            and benefits, the patient was deemed in                            satisfactory condition to undergo the procedure.  After obtaining informed consent, the colonoscope                            was passed under direct vision. Throughout the                            procedure, the patient's blood pressure, pulse, and                            oxygen saturations were monitored continuously. The                            Model PCF-[id number]) scope was introduced                            through the anus and  advanced to the the cecum,                            identified by appendiceal orifice and ileocecal                            valve. The ileocecal valve, appendiceal orifice,                            and rectum were photographed. The quality of the                            bowel preparation was good. The colonoscopy was                            performed without difficulty. The patient tolerated                            the procedure well. Scope In: 2:19:45 PM Scope Out: 2:33:49 PM Scope Withdrawal Time: 0 hours 12 minutes 43 seconds  Total Procedure Duration: 0 hours 14 minutes 4 seconds  Findings:                 The perianal and digital rectal examinations were                            normal.                           Two sessile polyps were found in the descending                            colon and transverse colon. The polyps were 6 to 7                            mm in size. These polyps were removed with a cold                            snare. Resection and retrieval were complete.  A few medium-mouthed diverticula were found in the                            left colon. There was no evidence of diverticular                            bleeding.                           Internal hemorrhoids were found during                            retroflexion. The hemorrhoids were small and Grade                            I (internal hemorrhoids that do not prolapse).                           The exam was otherwise without abnormality on                            direct and retroflexion views. Complications:            No immediate complications. Estimated blood loss:                            None. Estimated Blood Loss:     Estimated blood loss: none. Impression:               - Two 6 to 7 mm polyps in the descending colon and                            in the transverse colon, removed with a cold snare.                            Resected and  retrieved.                           - Mild diverticulosis in the left colon. There was                            no evidence of diverticular bleeding.                           - Internal hemorrhoids.                           - The examination was otherwise normal on direct                            and retroflexion views. Recommendation:           - Repeat colonoscopy in 5 years for surveillance.                           - Patient has a contact number available for  emergencies. The signs and symptoms of potential                            delayed complications were discussed with the                            patient. Return to normal activities tomorrow.                            Written discharge instructions were provided to the                            patient.                           - High fiber diet.                           - Continue present medications.                           - Await pathology results. Ladene Artist, MD 01/13/2018 2:38:22 PM This report has been signed electronically.

## 2018-01-14 ENCOUNTER — Telehealth: Payer: Self-pay

## 2018-01-14 NOTE — Telephone Encounter (Signed)
  Follow up Call-  Call back number 01/13/2018  Post procedure Call Back phone  # [phone]  Permission to leave phone message Yes  Some recent data might be hidden     Patient questions:  Do you have a fever, pain , or abdominal swelling? No. Pain Score  0 *  Have you tolerated food without any problems? Yes.    Have you been able to return to your normal activities? Yes.    Do you have any questions about your discharge instructions: Diet   No. Medications  No. Follow up visit  No.  Do you have questions or concerns about your Care? No.  Actions: * If pain score is 4 or above: No action needed, pain <4.

## 2018-01-21 ENCOUNTER — Encounter: Payer: Self-pay | Admitting: Gastroenterology

## 2018-03-25 DIAGNOSIS — I10 Essential (primary) hypertension: Secondary | ICD-10-CM | POA: Diagnosis not present

## 2018-03-25 DIAGNOSIS — I340 Nonrheumatic mitral (valve) insufficiency: Secondary | ICD-10-CM | POA: Diagnosis not present

## 2018-03-25 DIAGNOSIS — E1140 Type 2 diabetes mellitus with diabetic neuropathy, unspecified: Secondary | ICD-10-CM | POA: Diagnosis not present

## 2018-03-25 DIAGNOSIS — Z6841 Body Mass Index (BMI) 40.0 and over, adult: Secondary | ICD-10-CM | POA: Diagnosis not present

## 2018-03-25 DIAGNOSIS — E7849 Other hyperlipidemia: Secondary | ICD-10-CM | POA: Diagnosis not present

## 2018-08-05 DIAGNOSIS — E1140 Type 2 diabetes mellitus with diabetic neuropathy, unspecified: Secondary | ICD-10-CM | POA: Diagnosis not present

## 2018-08-05 DIAGNOSIS — Z6841 Body Mass Index (BMI) 40.0 and over, adult: Secondary | ICD-10-CM | POA: Diagnosis not present

## 2018-08-05 DIAGNOSIS — Z23 Encounter for immunization: Secondary | ICD-10-CM | POA: Diagnosis not present

## 2018-08-05 DIAGNOSIS — I340 Nonrheumatic mitral (valve) insufficiency: Secondary | ICD-10-CM | POA: Diagnosis not present

## 2018-08-05 DIAGNOSIS — I10 Essential (primary) hypertension: Secondary | ICD-10-CM | POA: Diagnosis not present

## 2018-08-05 DIAGNOSIS — E11621 Type 2 diabetes mellitus with foot ulcer: Secondary | ICD-10-CM | POA: Diagnosis not present

## 2018-12-22 DIAGNOSIS — E1140 Type 2 diabetes mellitus with diabetic neuropathy, unspecified: Secondary | ICD-10-CM | POA: Diagnosis not present

## 2018-12-22 DIAGNOSIS — R82998 Other abnormal findings in urine: Secondary | ICD-10-CM | POA: Diagnosis not present

## 2018-12-22 DIAGNOSIS — Z125 Encounter for screening for malignant neoplasm of prostate: Secondary | ICD-10-CM | POA: Diagnosis not present

## 2018-12-22 DIAGNOSIS — I10 Essential (primary) hypertension: Secondary | ICD-10-CM | POA: Diagnosis not present

## 2018-12-22 DIAGNOSIS — E7849 Other hyperlipidemia: Secondary | ICD-10-CM | POA: Diagnosis not present

## 2018-12-29 DIAGNOSIS — I517 Cardiomegaly: Secondary | ICD-10-CM | POA: Diagnosis not present

## 2018-12-29 DIAGNOSIS — Z0000 Encounter for general adult medical examination without abnormal findings: Secondary | ICD-10-CM | POA: Diagnosis not present

## 2018-12-29 DIAGNOSIS — Z6841 Body Mass Index (BMI) 40.0 and over, adult: Secondary | ICD-10-CM | POA: Diagnosis not present

## 2018-12-29 DIAGNOSIS — R5383 Other fatigue: Secondary | ICD-10-CM | POA: Diagnosis not present

## 2018-12-29 DIAGNOSIS — Z1331 Encounter for screening for depression: Secondary | ICD-10-CM | POA: Diagnosis not present

## 2018-12-29 DIAGNOSIS — Z77090 Contact with and (suspected) exposure to asbestos: Secondary | ICD-10-CM | POA: Diagnosis not present

## 2018-12-29 DIAGNOSIS — E11621 Type 2 diabetes mellitus with foot ulcer: Secondary | ICD-10-CM | POA: Diagnosis not present

## 2018-12-29 DIAGNOSIS — E7849 Other hyperlipidemia: Secondary | ICD-10-CM | POA: Diagnosis not present

## 2018-12-29 DIAGNOSIS — K219 Gastro-esophageal reflux disease without esophagitis: Secondary | ICD-10-CM | POA: Diagnosis not present

## 2018-12-29 DIAGNOSIS — E1140 Type 2 diabetes mellitus with diabetic neuropathy, unspecified: Secondary | ICD-10-CM | POA: Diagnosis not present

## 2018-12-29 DIAGNOSIS — I340 Nonrheumatic mitral (valve) insufficiency: Secondary | ICD-10-CM | POA: Diagnosis not present

## 2018-12-29 DIAGNOSIS — Z1339 Encounter for screening examination for other mental health and behavioral disorders: Secondary | ICD-10-CM | POA: Diagnosis not present

## 2018-12-30 DIAGNOSIS — M9901 Segmental and somatic dysfunction of cervical region: Secondary | ICD-10-CM | POA: Diagnosis not present

## 2018-12-30 DIAGNOSIS — M9903 Segmental and somatic dysfunction of lumbar region: Secondary | ICD-10-CM | POA: Diagnosis not present

## 2018-12-30 DIAGNOSIS — M9902 Segmental and somatic dysfunction of thoracic region: Secondary | ICD-10-CM | POA: Diagnosis not present

## 2018-12-30 DIAGNOSIS — M62830 Muscle spasm of back: Secondary | ICD-10-CM | POA: Diagnosis not present

## 2018-12-30 DIAGNOSIS — Z1212 Encounter for screening for malignant neoplasm of rectum: Secondary | ICD-10-CM | POA: Diagnosis not present

## 2019-01-03 DIAGNOSIS — M9903 Segmental and somatic dysfunction of lumbar region: Secondary | ICD-10-CM | POA: Diagnosis not present

## 2019-01-03 DIAGNOSIS — M62830 Muscle spasm of back: Secondary | ICD-10-CM | POA: Diagnosis not present

## 2019-01-03 DIAGNOSIS — M9901 Segmental and somatic dysfunction of cervical region: Secondary | ICD-10-CM | POA: Diagnosis not present

## 2019-01-03 DIAGNOSIS — M9902 Segmental and somatic dysfunction of thoracic region: Secondary | ICD-10-CM | POA: Diagnosis not present

## 2019-01-04 DIAGNOSIS — M9901 Segmental and somatic dysfunction of cervical region: Secondary | ICD-10-CM | POA: Diagnosis not present

## 2019-01-04 DIAGNOSIS — M62830 Muscle spasm of back: Secondary | ICD-10-CM | POA: Diagnosis not present

## 2019-01-04 DIAGNOSIS — M9903 Segmental and somatic dysfunction of lumbar region: Secondary | ICD-10-CM | POA: Diagnosis not present

## 2019-01-04 DIAGNOSIS — M9902 Segmental and somatic dysfunction of thoracic region: Secondary | ICD-10-CM | POA: Diagnosis not present

## 2019-01-06 DIAGNOSIS — M9901 Segmental and somatic dysfunction of cervical region: Secondary | ICD-10-CM | POA: Diagnosis not present

## 2019-01-06 DIAGNOSIS — M9902 Segmental and somatic dysfunction of thoracic region: Secondary | ICD-10-CM | POA: Diagnosis not present

## 2019-01-06 DIAGNOSIS — M62830 Muscle spasm of back: Secondary | ICD-10-CM | POA: Diagnosis not present

## 2019-01-06 DIAGNOSIS — M9903 Segmental and somatic dysfunction of lumbar region: Secondary | ICD-10-CM | POA: Diagnosis not present

## 2019-01-10 DIAGNOSIS — M9903 Segmental and somatic dysfunction of lumbar region: Secondary | ICD-10-CM | POA: Diagnosis not present

## 2019-01-10 DIAGNOSIS — M62830 Muscle spasm of back: Secondary | ICD-10-CM | POA: Diagnosis not present

## 2019-01-10 DIAGNOSIS — M9902 Segmental and somatic dysfunction of thoracic region: Secondary | ICD-10-CM | POA: Diagnosis not present

## 2019-01-10 DIAGNOSIS — M9901 Segmental and somatic dysfunction of cervical region: Secondary | ICD-10-CM | POA: Diagnosis not present

## 2019-01-12 DIAGNOSIS — M9902 Segmental and somatic dysfunction of thoracic region: Secondary | ICD-10-CM | POA: Diagnosis not present

## 2019-01-12 DIAGNOSIS — M9901 Segmental and somatic dysfunction of cervical region: Secondary | ICD-10-CM | POA: Diagnosis not present

## 2019-01-12 DIAGNOSIS — M62830 Muscle spasm of back: Secondary | ICD-10-CM | POA: Diagnosis not present

## 2019-01-12 DIAGNOSIS — M9903 Segmental and somatic dysfunction of lumbar region: Secondary | ICD-10-CM | POA: Diagnosis not present

## 2019-01-17 DIAGNOSIS — M9902 Segmental and somatic dysfunction of thoracic region: Secondary | ICD-10-CM | POA: Diagnosis not present

## 2019-01-17 DIAGNOSIS — M9901 Segmental and somatic dysfunction of cervical region: Secondary | ICD-10-CM | POA: Diagnosis not present

## 2019-01-17 DIAGNOSIS — M62830 Muscle spasm of back: Secondary | ICD-10-CM | POA: Diagnosis not present

## 2019-01-17 DIAGNOSIS — M9903 Segmental and somatic dysfunction of lumbar region: Secondary | ICD-10-CM | POA: Diagnosis not present

## 2019-01-19 DIAGNOSIS — M9902 Segmental and somatic dysfunction of thoracic region: Secondary | ICD-10-CM | POA: Diagnosis not present

## 2019-01-19 DIAGNOSIS — M9903 Segmental and somatic dysfunction of lumbar region: Secondary | ICD-10-CM | POA: Diagnosis not present

## 2019-01-19 DIAGNOSIS — M62830 Muscle spasm of back: Secondary | ICD-10-CM | POA: Diagnosis not present

## 2019-01-19 DIAGNOSIS — M9901 Segmental and somatic dysfunction of cervical region: Secondary | ICD-10-CM | POA: Diagnosis not present

## 2019-01-24 DIAGNOSIS — S82831A Other fracture of upper and lower end of right fibula, initial encounter for closed fracture: Secondary | ICD-10-CM | POA: Diagnosis not present

## 2019-01-24 DIAGNOSIS — M79604 Pain in right leg: Secondary | ICD-10-CM | POA: Diagnosis not present

## 2019-01-25 DIAGNOSIS — S8264XA Nondisplaced fracture of lateral malleolus of right fibula, initial encounter for closed fracture: Secondary | ICD-10-CM | POA: Diagnosis not present

## 2019-02-01 DIAGNOSIS — S82401D Unspecified fracture of shaft of right fibula, subsequent encounter for closed fracture with routine healing: Secondary | ICD-10-CM | POA: Diagnosis not present

## 2019-02-08 DIAGNOSIS — S82401D Unspecified fracture of shaft of right fibula, subsequent encounter for closed fracture with routine healing: Secondary | ICD-10-CM | POA: Diagnosis not present

## 2019-02-17 DIAGNOSIS — H401132 Primary open-angle glaucoma, bilateral, moderate stage: Secondary | ICD-10-CM | POA: Diagnosis not present

## 2019-02-17 DIAGNOSIS — H01004 Unspecified blepharitis left upper eyelid: Secondary | ICD-10-CM | POA: Diagnosis not present

## 2019-02-17 DIAGNOSIS — S82401D Unspecified fracture of shaft of right fibula, subsequent encounter for closed fracture with routine healing: Secondary | ICD-10-CM | POA: Diagnosis not present

## 2019-02-17 DIAGNOSIS — H01001 Unspecified blepharitis right upper eyelid: Secondary | ICD-10-CM | POA: Diagnosis not present

## 2019-02-24 DIAGNOSIS — M79672 Pain in left foot: Secondary | ICD-10-CM | POA: Diagnosis not present

## 2019-02-24 DIAGNOSIS — M2021 Hallux rigidus, right foot: Secondary | ICD-10-CM | POA: Diagnosis not present

## 2019-02-24 DIAGNOSIS — S82401D Unspecified fracture of shaft of right fibula, subsequent encounter for closed fracture with routine healing: Secondary | ICD-10-CM | POA: Diagnosis not present

## 2019-02-24 DIAGNOSIS — M7741 Metatarsalgia, right foot: Secondary | ICD-10-CM | POA: Diagnosis not present

## 2019-02-24 DIAGNOSIS — M79671 Pain in right foot: Secondary | ICD-10-CM | POA: Diagnosis not present

## 2019-03-14 DIAGNOSIS — S82401D Unspecified fracture of shaft of right fibula, subsequent encounter for closed fracture with routine healing: Secondary | ICD-10-CM | POA: Diagnosis not present

## 2019-03-28 DIAGNOSIS — S82401D Unspecified fracture of shaft of right fibula, subsequent encounter for closed fracture with routine healing: Secondary | ICD-10-CM | POA: Diagnosis not present

## 2019-04-28 DIAGNOSIS — E785 Hyperlipidemia, unspecified: Secondary | ICD-10-CM | POA: Diagnosis not present

## 2019-04-28 DIAGNOSIS — R0609 Other forms of dyspnea: Secondary | ICD-10-CM | POA: Diagnosis not present

## 2019-04-28 DIAGNOSIS — I10 Essential (primary) hypertension: Secondary | ICD-10-CM | POA: Diagnosis not present

## 2019-04-28 DIAGNOSIS — E1140 Type 2 diabetes mellitus with diabetic neuropathy, unspecified: Secondary | ICD-10-CM | POA: Diagnosis not present

## 2019-04-28 DIAGNOSIS — E11621 Type 2 diabetes mellitus with foot ulcer: Secondary | ICD-10-CM | POA: Diagnosis not present

## 2019-04-28 DIAGNOSIS — I340 Nonrheumatic mitral (valve) insufficiency: Secondary | ICD-10-CM | POA: Diagnosis not present

## 2019-05-06 DIAGNOSIS — E1140 Type 2 diabetes mellitus with diabetic neuropathy, unspecified: Secondary | ICD-10-CM | POA: Diagnosis not present

## 2019-06-09 ENCOUNTER — Emergency Department (HOSPITAL_COMMUNITY): Payer: PPO

## 2019-06-09 ENCOUNTER — Emergency Department (HOSPITAL_COMMUNITY)
Admission: EM | Admit: 2019-06-09 | Discharge: 2019-06-09 | Disposition: A | Discharge status: Home / Self Care | Payer: PPO | Attending: Emergency Medicine | Admitting: Emergency Medicine

## 2019-06-09 ENCOUNTER — Other Ambulatory Visit: Payer: Self-pay

## 2019-06-09 ENCOUNTER — Encounter (HOSPITAL_COMMUNITY): Payer: Self-pay | Admitting: Emergency Medicine

## 2019-06-09 DIAGNOSIS — Z7982 Long term (current) use of aspirin: Secondary | ICD-10-CM | POA: Diagnosis not present

## 2019-06-09 DIAGNOSIS — R0689 Other abnormalities of breathing: Secondary | ICD-10-CM | POA: Diagnosis not present

## 2019-06-09 DIAGNOSIS — Z79899 Other long term (current) drug therapy: Secondary | ICD-10-CM | POA: Insufficient documentation

## 2019-06-09 DIAGNOSIS — S20211A Contusion of right front wall of thorax, initial encounter: Secondary | ICD-10-CM | POA: Insufficient documentation

## 2019-06-09 DIAGNOSIS — Z87891 Personal history of nicotine dependence: Secondary | ICD-10-CM | POA: Diagnosis not present

## 2019-06-09 DIAGNOSIS — I10 Essential (primary) hypertension: Secondary | ICD-10-CM | POA: Insufficient documentation

## 2019-06-09 DIAGNOSIS — R58 Hemorrhage, not elsewhere classified: Secondary | ICD-10-CM | POA: Diagnosis not present

## 2019-06-09 DIAGNOSIS — S0101XA Laceration without foreign body of scalp, initial encounter: Secondary | ICD-10-CM | POA: Diagnosis not present

## 2019-06-09 DIAGNOSIS — Y9389 Activity, other specified: Secondary | ICD-10-CM | POA: Insufficient documentation

## 2019-06-09 DIAGNOSIS — Y9289 Other specified places as the place of occurrence of the external cause: Secondary | ICD-10-CM | POA: Diagnosis not present

## 2019-06-09 DIAGNOSIS — S0990XA Unspecified injury of head, initial encounter: Secondary | ICD-10-CM | POA: Diagnosis not present

## 2019-06-09 DIAGNOSIS — Z23 Encounter for immunization: Secondary | ICD-10-CM | POA: Diagnosis not present

## 2019-06-09 DIAGNOSIS — Y999 Unspecified external cause status: Secondary | ICD-10-CM | POA: Diagnosis not present

## 2019-06-09 DIAGNOSIS — R0902 Hypoxemia: Secondary | ICD-10-CM | POA: Diagnosis not present

## 2019-06-09 MED ORDER — LIDOCAINE-EPINEPHRINE (PF) 2 %-1:200000 IJ SOLN
10.0000 mL | Freq: Once | INTRAMUSCULAR | Status: AC
  Administered 2019-06-09: 10 mL
  Filled 2019-06-09: qty 10

## 2019-06-09 MED ORDER — TETANUS-DIPHTH-ACELL PERTUSSIS 5-2.5-18.5 LF-MCG/0.5 IM SUSP
0.5000 mL | Freq: Once | INTRAMUSCULAR | Status: AC
  Administered 2019-06-09: 0.5 mL via INTRAMUSCULAR
  Filled 2019-06-09: qty 0.5
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
## 2019-06-09 NOTE — Discharge Instructions (Addendum)
Ice packs to the injured areas as needed for pain or swelling.  You can safely take acetaminophen for pain if needed.  Keep the staples clean and dry, do go home this morning and wash her hair well to get all the blood out of your hair and then as needed.  The staples need to be removed in 1 week.  That can be done at the ED, your primary care doctor, or the [facility] urgent care.  Return to the ED for any problems on the head injury sheet or if you develop new problems.
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
## 2019-06-09 NOTE — ED Notes (Signed)
Officer from [facility] Department at bedside to collect pt statement.

## 2019-06-16 ENCOUNTER — Emergency Department (HOSPITAL_COMMUNITY)
Admission: EM | Admit: 2019-06-16 | Discharge: 2019-06-16 | Disposition: A | Discharge status: Home / Self Care | Payer: PPO | Attending: Emergency Medicine | Admitting: Emergency Medicine

## 2019-06-16 ENCOUNTER — Other Ambulatory Visit: Payer: Self-pay

## 2019-06-16 ENCOUNTER — Encounter (HOSPITAL_COMMUNITY): Payer: Self-pay

## 2019-06-16 DIAGNOSIS — I10 Essential (primary) hypertension: Secondary | ICD-10-CM | POA: Insufficient documentation

## 2019-06-16 DIAGNOSIS — Z7982 Long term (current) use of aspirin: Secondary | ICD-10-CM | POA: Insufficient documentation

## 2019-06-16 DIAGNOSIS — X58XXXD Exposure to other specified factors, subsequent encounter: Secondary | ICD-10-CM | POA: Diagnosis not present

## 2019-06-16 DIAGNOSIS — Z79899 Other long term (current) drug therapy: Secondary | ICD-10-CM | POA: Diagnosis not present

## 2019-06-16 DIAGNOSIS — E785 Hyperlipidemia, unspecified: Secondary | ICD-10-CM | POA: Insufficient documentation

## 2019-06-16 DIAGNOSIS — Z4802 Encounter for removal of sutures: Secondary | ICD-10-CM

## 2019-06-16 DIAGNOSIS — Z87891 Personal history of nicotine dependence: Secondary | ICD-10-CM | POA: Insufficient documentation

## 2019-06-16 DIAGNOSIS — S0101XD Laceration without foreign body of scalp, subsequent encounter: Secondary | ICD-10-CM | POA: Diagnosis not present

## 2019-06-16 NOTE — ED Provider Notes (Signed)
[facility] EMERGENCY DEPARTMENT Provider Note   CSN: [id number] Arrival date & time: 06/16/19  0848     History   Chief Complaint Chief Complaint  Patient presents with  . Suture / Staple Removal    HPI [name] is a 73 y.o. male resenting for scalp staple removal.  He was here 1 week ago for laceration sustained to the scalp which was repaired with staples.  He denies any problems or concerns with this wound site, he states there was some mild drainage a couple of days ago but that has resolved.  He denies any significant pain.     The history is provided by the patient.    Past Medical History:  Diagnosis Date  . Anal fissure   . Arthritis    knees, right foot   . BPH (benign prostatic hypertrophy)   . Diverticulosis   . DJD (degenerative joint disease)   . ED (erectile dysfunction)   . GERD (gastroesophageal reflux disease)   . Heart murmur    pt states he " outgrew"   . Hyperlipidemia   . Hypertension   . Internal hemorrhoids   . Neuropathy of foot   . Sleep apnea    does not use machine , uses 02 2l/[place] at nite   . Tubular adenoma of colon 12/2013    There are no active problems to display for this patient.   Past Surgical History:  Procedure Laterality Date  . ANAL FISSURE REPAIR    . FOOT FRACTURE SURGERY    . JOINT REPLACEMENT     left knee replacement   . TOTAL KNEE ARTHROPLASTY  12/12/2011   Procedure: TOTAL KNEE ARTHROPLASTY;  Surgeon: Cynda Familia, MD;  Location: [facility]S;  Service: Orthopedics;  Laterality: Right;        Home Medications    Prior to Admission medications   Medication Sig Start Date End Date Taking? Authorizing Provider  aspirin 81 MG tablet Take 81 mg by mouth daily.    [provider]  atorvastatin (LIPITOR) 80 MG tablet Take 80 mg by mouth daily.    [provider]  carvedilol (COREG) 6.25 MG tablet Take 6.25 mg by mouth 2 (two) times daily with a meal.    [provider]   cholecalciferol (VITAMIN D) 1000 UNITS tablet Take 1,000 Units by mouth daily with breakfast.    [provider]  DULoxetine (CYMBALTA) 60 MG capsule Take 60 mg by mouth daily.    [provider]  Ezetimibe (ZETIA PO) Take 2.5 mg by mouth every other day.    [provider]  fish oil-omega-3 fatty acids 1000 MG capsule Take 1 g by mouth daily with breakfast.     [provider]  gabapentin (NEURONTIN) 300 MG capsule Take 600 mg by mouth 3 (three) times daily.     [provider]  glipiZIDE (GLUCOTROL XL) 2.5 MG 24 hr tablet Take 2.5 mg by mouth 2 (two) times daily.    [provider]  Glucosamine-Chondroitin (OSTEO BI-FLEX REGULAR STRENGTH PO) Take 1,000 mg by mouth.    [provider]  lisinopril-hydrochlorothiazide (PRINZIDE,ZESTORETIC) 20-12.5 MG tablet Take 1 tablet by mouth daily.    [provider]  metFORMIN (GLUCOPHAGE) 500 MG tablet Take 1,000 mg by mouth 2 (two) times daily with a meal.    [provider]  omeprazole (PRILOSEC) 20 MG capsule Take 20 mg by mouth daily.    [provider]  prazosin (MINIPRESS)  5 MG capsule Take 5 mg by mouth at bedtime.    [provider]  venlafaxine (EFFEXOR) 37.5 MG tablet Take 37.5 mg by mouth 2 (two) times daily.    [provider]  vitamin B-12 (CYANOCOBALAMIN) 1000 MCG tablet Take 2,000 mcg by mouth daily with breakfast.     [provider]    Family History Family History  Problem Relation Age of Onset  . Heart disease Mother   . Heart disease Father   . Heart disease Brother   . Colon cancer Maternal Grandmother     Social History Social History   Tobacco Use  . Smoking status: Former Smoker    Types: Cigarettes    Quit date: 11/17/1981    Years since quitting: 37.6  . Smokeless tobacco: Former Systems developer    Types: Snuff    Quit date: 11/17/2001  Substance Use Topics  . Alcohol use: Yes    Alcohol/week: 8.0 standard drinks     Types: 8 Cans of beer per week  . Drug use: No     Allergies   Patient has no known allergies.   Review of Systems Review of Systems  Constitutional: Negative for chills and fever.  HENT: Negative.   Respiratory: Negative.   Cardiovascular: Negative.   Gastrointestinal: Negative.   Skin: Positive for wound.  Neurological: Negative.      Physical Exam Updated Vital Signs BP (!) 173/86   Pulse 67   Temp 98.1 F (36.7 C) (Oral)   Resp 17   Ht 5\' 7"  (1.702 m)   Wt 120.2 kg   SpO2 96%   BMI 41.50 kg/m   Physical Exam Constitutional:      Appearance: He is well-developed.  HENT:     Head: Normocephalic.  Cardiovascular:     Rate and Rhythm: Normal rate.  Pulmonary:     Effort: Pulmonary effort is normal.  Skin:    Findings: Laceration present.     Comments: Well-healing scalp laceration right parietal scalp.  #6 staples in place.  Neurological:     Mental Status: He is alert and oriented to person, place, and time.     Sensory: No sensory deficit.      ED Treatments / Results  Labs (all labs ordered are listed, but only abnormal results are displayed) Labs Reviewed - No data to display  EKG None  Radiology No results found.  Procedures Procedures (including critical care time)  SUTURE REMOVAL Performed by: Evalee Jefferson  Consent: Verbal consent obtained. Patient identity confirmed: provided demographic data Time out: Immediately prior to procedure a "time out" was called to verify the correct patient, procedure, equipment, support staff and site/side marked as required.  Location details: parietal scalp, right  Wound Appearance: clean  Sutures/Staples Removed: staples, #6  Facility: sutures placed in this facility Patient tolerance: Patient tolerated the procedure well with no immediate complications.     Medications Ordered in ED Medications - No data to display   Initial Impression / Assessment and Plan / ED Course  I have  reviewed the triage vital signs and the nursing notes.  Pertinent labs & imaging results that were available during my care of the patient were reviewed by me and considered in my medical decision making (see chart for details).        Prn f/u anticipated  Final Clinical Impressions(s) / ED Diagnoses   Final diagnoses:  Encounter for staple removal    ED Discharge Orders    None  Evalee Jefferson, PA-C 06/16/19 4827    Lajean Saver, MD 06/17/19 2059
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
## 2019-06-16 NOTE — Discharge Instructions (Signed)
Get rechecked for any problems or concerns with your wound.  You may gently wash the site as needed at this time.

## 2019-08-29 DIAGNOSIS — M1990 Unspecified osteoarthritis, unspecified site: Secondary | ICD-10-CM | POA: Diagnosis not present

## 2019-08-29 DIAGNOSIS — G629 Polyneuropathy, unspecified: Secondary | ICD-10-CM | POA: Diagnosis not present

## 2019-08-29 DIAGNOSIS — I10 Essential (primary) hypertension: Secondary | ICD-10-CM | POA: Diagnosis not present

## 2019-08-29 DIAGNOSIS — E1140 Type 2 diabetes mellitus with diabetic neuropathy, unspecified: Secondary | ICD-10-CM | POA: Diagnosis not present

## 2019-08-29 DIAGNOSIS — R0609 Other forms of dyspnea: Secondary | ICD-10-CM | POA: Diagnosis not present

## 2019-08-29 DIAGNOSIS — I340 Nonrheumatic mitral (valve) insufficiency: Secondary | ICD-10-CM | POA: Diagnosis not present

## 2019-08-29 DIAGNOSIS — E11621 Type 2 diabetes mellitus with foot ulcer: Secondary | ICD-10-CM | POA: Diagnosis not present

## 2019-08-29 DIAGNOSIS — E785 Hyperlipidemia, unspecified: Secondary | ICD-10-CM | POA: Diagnosis not present

## 2019-11-14 DIAGNOSIS — M25572 Pain in left ankle and joints of left foot: Secondary | ICD-10-CM | POA: Diagnosis not present

## 2019-11-14 DIAGNOSIS — E1140 Type 2 diabetes mellitus with diabetic neuropathy, unspecified: Secondary | ICD-10-CM | POA: Diagnosis not present

## 2019-11-14 DIAGNOSIS — S8262XA Displaced fracture of lateral malleolus of left fibula, initial encounter for closed fracture: Secondary | ICD-10-CM | POA: Diagnosis not present

## 2019-11-16 DIAGNOSIS — S8262XD Displaced fracture of lateral malleolus of left fibula, subsequent encounter for closed fracture with routine healing: Secondary | ICD-10-CM | POA: Diagnosis not present

## 2019-11-30 DIAGNOSIS — M25572 Pain in left ankle and joints of left foot: Secondary | ICD-10-CM | POA: Diagnosis not present

## 2019-11-30 DIAGNOSIS — S8262XD Displaced fracture of lateral malleolus of left fibula, subsequent encounter for closed fracture with routine healing: Secondary | ICD-10-CM | POA: Diagnosis not present

## 2019-12-28 DIAGNOSIS — M25572 Pain in left ankle and joints of left foot: Secondary | ICD-10-CM | POA: Diagnosis not present

## 2019-12-28 DIAGNOSIS — S8262XD Displaced fracture of lateral malleolus of left fibula, subsequent encounter for closed fracture with routine healing: Secondary | ICD-10-CM | POA: Diagnosis not present

## 2020-01-04 DIAGNOSIS — M1990 Unspecified osteoarthritis, unspecified site: Secondary | ICD-10-CM | POA: Diagnosis not present

## 2020-01-04 DIAGNOSIS — G629 Polyneuropathy, unspecified: Secondary | ICD-10-CM | POA: Diagnosis not present

## 2020-01-04 DIAGNOSIS — E1140 Type 2 diabetes mellitus with diabetic neuropathy, unspecified: Secondary | ICD-10-CM | POA: Diagnosis not present

## 2020-01-25 DIAGNOSIS — M25572 Pain in left ankle and joints of left foot: Secondary | ICD-10-CM | POA: Diagnosis not present

## 2020-01-25 DIAGNOSIS — S8262XD Displaced fracture of lateral malleolus of left fibula, subsequent encounter for closed fracture with routine healing: Secondary | ICD-10-CM | POA: Diagnosis not present

## 2020-01-25 DIAGNOSIS — E1142 Type 2 diabetes mellitus with diabetic polyneuropathy: Secondary | ICD-10-CM | POA: Diagnosis not present

## 2020-01-26 ENCOUNTER — Encounter: Payer: Self-pay | Admitting: Physical Therapy

## 2020-01-26 ENCOUNTER — Other Ambulatory Visit: Payer: Self-pay

## 2020-01-26 ENCOUNTER — Ambulatory Visit: Payer: PPO | Attending: Orthopedic Surgery | Admitting: Physical Therapy

## 2020-01-26 DIAGNOSIS — R2681 Unsteadiness on feet: Secondary | ICD-10-CM

## 2020-01-26 DIAGNOSIS — M6281 Muscle weakness (generalized): Secondary | ICD-10-CM | POA: Diagnosis not present

## 2020-01-26 DIAGNOSIS — R296 Repeated falls: Secondary | ICD-10-CM | POA: Diagnosis not present

## 2020-01-26 NOTE — Therapy (Signed)
[facility] [place], [place], [address] Phone: [phone]   Fax:  [phone]  Physical Therapy Evaluation  Patient Details  Name: [name] MRN: [id number] Date of Birth: 03-Jan-1946 Referring Provider (PT): Wylene Simmer, MD   Encounter Date: 01/26/2020  PT End of Session - 01/26/20 1246    Visit Number  1    Number of Visits  12    Date for PT Re-Evaluation  03/15/20    Authorization Type  Patient will be independent with HEP.    PT Start Time  0900    PT Stop Time  0940    PT Time Calculation (min)  40 min    Equipment Utilized During Treatment  Other (comment)   SPC   Activity Tolerance  Patient tolerated treatment well    Behavior During Therapy  WFL for tasks assessed/performed       Past Medical History:  Diagnosis Date  . Anal fissure   . Arthritis    knees, right foot   . BPH (benign prostatic hypertrophy)   . Diverticulosis   . DJD (degenerative joint disease)   . ED (erectile dysfunction)   . GERD (gastroesophageal reflux disease)   . Heart murmur    pt states he " outgrew"   . Hyperlipidemia   . Hypertension   . Internal hemorrhoids   . Neuropathy of foot   . Sleep apnea    does not use machine , uses 02 2l/[place] at nite   . Tubular adenoma of colon 12/2013    Past Surgical History:  Procedure Laterality Date  . ANAL FISSURE REPAIR    . FOOT FRACTURE SURGERY    . JOINT REPLACEMENT     left knee replacement   . TOTAL KNEE ARTHROPLASTY  12/12/2011   Procedure: TOTAL KNEE ARTHROPLASTY;  Surgeon: Cynda Familia, MD;  Location: [facility];  Service: Orthopedics;  Laterality: Right;    There were no vitals filed for this visit.   Subjective Assessment - 01/26/20 1243    Subjective  COVID-19 screening performed upon arrival.Patient arrives to physical therapy with reports of falls, bilateral LE weakness and bilateral LE pain that has progressively worsened since January 2021. Patient  reports history of bilateral ankle fractures in 2020 that has contributed to his increased falls and weakness. Patient reports most recent fall occurred in the kitchen where his legs just gave way. Patient reports he is able to get up from a fall independently. Patient reports utilizing a cane most of the time but utilizes crutches if bilateral leg pain becomes too high. Patient's goals are to decrease pain, improve movement, and stop falls.    Pertinent History  L ankle fx 10/2019, R ankle fx 04/2019, HTN, DM, Neuropathy, B TKA    Limitations  Walking;House hold activities;Standing    How long can you stand comfortably?  short periods    How long can you walk comfortably?  short periods    Patient Stated Goals  decrease bilateral LE pain, improve balance and decrease falls    Currently in Pain?  No/denies         [facility] PT Assessment - 01/26/20 0001      Assessment   Medical Diagnosis  Displace fracture of lateral malleolus of left fibula, subsequent encounter for closed fracture with routine healing    Referring Provider (PT)  Wylene Simmer, MD    Onset Date/Surgical Date  --   10/2019   Next MD Visit  n/a    Prior Therapy  no      Precautions   Precautions  Fall      Restrictions   Weight Bearing Restrictions  No      Balance Screen   Has the patient fallen in the past 6 months  Yes    How many times?  2    Has the patient had a decrease in activity level because of a fear of falling?   Yes    Is the patient reluctant to leave their home because of a fear of falling?   Yes      [place]  Private residence    Living Arrangements  Spouse/significant other    Home Access  Stairs to enter    Entrance Stairs-Number of Steps  7    Trainer  Two level    Alternate Level Stairs-Number of Steps  13    Alternate Level Stairs-Rails  Left      Prior Function   Level of Independence  Independent      ROM / Strength   AROM / PROM / Strength  Strength       Strength   Strength Assessment Site  Hip;Knee;Ankle    Right/Left Hip  Right;Left    Right Hip Flexion  3/5    Left Hip Flexion  3/5    Right/Left Knee  Right;Left    Right Knee Flexion  4/5    Right Knee Extension  4+/5    Left Knee Flexion  4/5    Left Knee Extension  4+/5    Right/Left Ankle  Right;Left    Right Ankle Dorsiflexion  4/5    Right Ankle Inversion  4-/5    Right Ankle Eversion  4-/5    Left Ankle Dorsiflexion  3+/5    Left Ankle Inversion  3+/5    Left Ankle Eversion  3-/5      Transfers   Transfers  Independent with all Transfers      Ambulation/Gait   Assistive device  Straight cane    Gait Pattern  Step-through pattern;Decreased stride length;Wide base of support      Standardized Balance Assessment   Standardized Balance Assessment  Berg Balance Test      Berg Balance Test   Sit to Stand  Able to stand without using hands and stabilize independently    Standing Unsupported  Able to stand 2 minutes with supervision    Sitting with Back Unsupported but Feet Supported on Floor or Stool  Able to sit safely and securely 2 minutes    Stand to Sit  Controls descent by using hands    Transfers  Able to transfer safely, definite need of hands    Standing Unsupported with Eyes Closed  Able to stand 10 seconds with supervision    Standing Unsupported with Feet Together  Needs help to attain position but able to stand for 30 seconds with feet together    From Standing, Reach Forward with Outstretched Arm  Can reach confidently >25 cm (10")    From Standing Position, Pick up Object from Floor  Able to pick up shoe safely and easily    From Standing Position, Turn to Look Behind Over each Shoulder  Turn sideways only but maintains balance    Turn 360 Degrees  Able to turn 360 degrees safely but slowly    Standing Unsupported, Alternately Place Feet on Step/Stool  Able to complete >2 steps/needs minimal assist  Standing Unsupported, One Foot in [employer] balance  while stepping or standing    Standing on One Leg  Unable to try or needs assist to prevent fall    Total Score  34    Berg comment:  limited standing tolerance secondary to neuropathy and bilateral LE pain                Objective measurements completed on examination: See above findings.              PT Education - 01/26/20 1245    Education Details  HR/TR, seated clams, marching red theraband, IN/EV    Person(s) Educated  Patient    Methods  Explanation;Demonstration;Handout    Comprehension  Verbalized understanding;Returned demonstration          PT Long Term Goals - 01/26/20 2148      PT LONG TERM GOAL #1   Title  Patient will be independent with HEP    Time  6    Period  Weeks    Status  New      PT LONG TERM GOAL #2   Title  Patient will demonstrate decrease fall risk as noted by Merrilee Jansky Balance scale to 40/56.    Time  6    Period  Weeks    Status  New      PT LONG TERM GOAL #3   Title  Patient will demonstrate 4+/5 bilateral LE MMT to improve stability during functional tasks.    Time  6    Period  Weeks    Status  New      PT LONG TERM GOAL #4   Title  Patient will report ability to negotiate steps with reciprocating pattern with one railing to safely access home.    Time  6    Period  Weeks    Status  New             Plan - 01/26/20 2145    Clinical Impression Statement  Patient is a 74 year old male who presents to physical therapy with decreased bilateral LE MMT and bilateral LE pain that progressively worsened in the past 3 months. Patient's 5x sit to stand test of 14 and Berg Balance Score of 34/56 categorizes him as a fall risk. Patient ambulates with a straight cane with step through gait pattern and wide base of support. Patient and PT discussed plan of care, components of balance, and HEP to improve balance strategies to prevent a loss of balance. Patient reported understanding. Patient would benefit from skilled physical  therapy to address deficits and address patient's goals.    Personal Factors and Comorbidities  Comorbidity 3+;Age    Comorbidities  L ankle fx 10/2019, R ankle fx 04/2019, HTN, DM, Neuropathy, B TKA    Examination-Activity Limitations  Locomotion Level;Stairs;Stand    Stability/Clinical Decision Making  Stable/Uncomplicated    Clinical Decision Making  Low    Rehab Potential  Good    PT Frequency  2x / week    PT Duration  6 weeks    PT Treatment/Interventions  ADLs/Self Care Home Management;Gait training;Stair training;Functional mobility training;Therapeutic activities;Therapeutic exercise;Balance training;Neuromuscular re-education;Manual techniques;Passive range of motion;Patient/family education;Cryotherapy;Health visitor;Taping    PT Next Visit Plan  nustep, balance activities, left ankle AROM and bilateral strengthening, progress from seated and supine TEs to standing per patient tolerance.    PT Home Exercise Plan  see patient education section    Consulted and Agree with Plan  of Care  Patient       Patient will benefit from skilled therapeutic intervention in order to improve the following deficits and impairments:  Decreased activity tolerance, Decreased balance, Decreased range of motion, Decreased strength, Pain  Visit Diagnosis: Unsteadiness on feet - Plan: PT plan of care cert/re-cert  Repeated falls - Plan: PT plan of care cert/re-cert  Muscle weakness (generalized) - Plan: PT plan of care cert/re-cert     Problem List There are no problems to display for this patient.   Gabriela Eves, PT, DPT 01/26/2020, 10:06 PM  [facility] [address] [place], [place], [address] Phone: [phone]   Fax:  [phone]  Name: [name] MRN: [id number] Date of Birth: 01-28-1946

## 2020-01-31 ENCOUNTER — Ambulatory Visit: Payer: PPO | Admitting: Physical Therapy

## 2020-01-31 DIAGNOSIS — M791 Myalgia, unspecified site: Secondary | ICD-10-CM | POA: Diagnosis not present

## 2020-01-31 DIAGNOSIS — E1140 Type 2 diabetes mellitus with diabetic neuropathy, unspecified: Secondary | ICD-10-CM | POA: Diagnosis not present

## 2020-01-31 DIAGNOSIS — M1990 Unspecified osteoarthritis, unspecified site: Secondary | ICD-10-CM | POA: Diagnosis not present

## 2020-01-31 DIAGNOSIS — G629 Polyneuropathy, unspecified: Secondary | ICD-10-CM | POA: Diagnosis not present

## 2020-01-31 DIAGNOSIS — Z7689 Persons encountering health services in other specified circumstances: Secondary | ICD-10-CM | POA: Diagnosis not present

## 2020-01-31 DIAGNOSIS — M5117 Intervertebral disc disorders with radiculopathy, lumbosacral region: Secondary | ICD-10-CM | POA: Diagnosis not present

## 2020-02-02 ENCOUNTER — Encounter: Payer: PPO | Admitting: Physical Therapy

## 2020-02-06 DIAGNOSIS — E1140 Type 2 diabetes mellitus with diabetic neuropathy, unspecified: Secondary | ICD-10-CM | POA: Diagnosis not present

## 2020-02-06 DIAGNOSIS — E7849 Other hyperlipidemia: Secondary | ICD-10-CM | POA: Diagnosis not present

## 2020-02-06 DIAGNOSIS — Z125 Encounter for screening for malignant neoplasm of prostate: Secondary | ICD-10-CM | POA: Diagnosis not present

## 2020-02-08 DIAGNOSIS — M5136 Other intervertebral disc degeneration, lumbar region: Secondary | ICD-10-CM | POA: Diagnosis not present

## 2020-02-08 DIAGNOSIS — M545 Low back pain: Secondary | ICD-10-CM | POA: Diagnosis not present

## 2020-02-08 DIAGNOSIS — Z6841 Body Mass Index (BMI) 40.0 and over, adult: Secondary | ICD-10-CM | POA: Diagnosis not present

## 2020-02-08 DIAGNOSIS — M48062 Spinal stenosis, lumbar region with neurogenic claudication: Secondary | ICD-10-CM | POA: Diagnosis not present

## 2020-02-13 DIAGNOSIS — Z77090 Contact with and (suspected) exposure to asbestos: Secondary | ICD-10-CM | POA: Diagnosis not present

## 2020-02-13 DIAGNOSIS — I517 Cardiomegaly: Secondary | ICD-10-CM | POA: Diagnosis not present

## 2020-02-13 DIAGNOSIS — N400 Enlarged prostate without lower urinary tract symptoms: Secondary | ICD-10-CM | POA: Diagnosis not present

## 2020-02-13 DIAGNOSIS — M5117 Intervertebral disc disorders with radiculopathy, lumbosacral region: Secondary | ICD-10-CM | POA: Diagnosis not present

## 2020-02-13 DIAGNOSIS — R82998 Other abnormal findings in urine: Secondary | ICD-10-CM | POA: Diagnosis not present

## 2020-02-13 DIAGNOSIS — E1140 Type 2 diabetes mellitus with diabetic neuropathy, unspecified: Secondary | ICD-10-CM | POA: Diagnosis not present

## 2020-02-13 DIAGNOSIS — I340 Nonrheumatic mitral (valve) insufficiency: Secondary | ICD-10-CM | POA: Diagnosis not present

## 2020-02-13 DIAGNOSIS — E11621 Type 2 diabetes mellitus with foot ulcer: Secondary | ICD-10-CM | POA: Diagnosis not present

## 2020-02-13 DIAGNOSIS — M5416 Radiculopathy, lumbar region: Secondary | ICD-10-CM | POA: Diagnosis not present

## 2020-02-13 DIAGNOSIS — Z1331 Encounter for screening for depression: Secondary | ICD-10-CM | POA: Diagnosis not present

## 2020-02-13 DIAGNOSIS — R0609 Other forms of dyspnea: Secondary | ICD-10-CM | POA: Diagnosis not present

## 2020-02-13 DIAGNOSIS — M1990 Unspecified osteoarthritis, unspecified site: Secondary | ICD-10-CM | POA: Diagnosis not present

## 2020-02-13 DIAGNOSIS — Z1339 Encounter for screening examination for other mental health and behavioral disorders: Secondary | ICD-10-CM | POA: Diagnosis not present

## 2020-02-13 DIAGNOSIS — Z0000 Encounter for general adult medical examination without abnormal findings: Secondary | ICD-10-CM | POA: Diagnosis not present

## 2020-02-15 DIAGNOSIS — Z1212 Encounter for screening for malignant neoplasm of rectum: Secondary | ICD-10-CM | POA: Diagnosis not present

## 2020-02-20 DIAGNOSIS — M5416 Radiculopathy, lumbar region: Secondary | ICD-10-CM | POA: Diagnosis not present

## 2020-03-12 DIAGNOSIS — S8262XD Displaced fracture of lateral malleolus of left fibula, subsequent encounter for closed fracture with routine healing: Secondary | ICD-10-CM | POA: Diagnosis not present

## 2020-03-19 DIAGNOSIS — M5416 Radiculopathy, lumbar region: Secondary | ICD-10-CM | POA: Diagnosis not present

## 2020-03-19 DIAGNOSIS — M5136 Other intervertebral disc degeneration, lumbar region: Secondary | ICD-10-CM | POA: Diagnosis not present

## 2020-03-19 DIAGNOSIS — M48062 Spinal stenosis, lumbar region with neurogenic claudication: Secondary | ICD-10-CM | POA: Diagnosis not present

## 2020-03-31 DIAGNOSIS — W19XXXD Unspecified fall, subsequent encounter: Secondary | ICD-10-CM | POA: Diagnosis not present

## 2020-03-31 DIAGNOSIS — M791 Myalgia, unspecified site: Secondary | ICD-10-CM | POA: Diagnosis not present

## 2020-03-31 DIAGNOSIS — M542 Cervicalgia: Secondary | ICD-10-CM | POA: Diagnosis not present

## 2020-08-14 DIAGNOSIS — Z23 Encounter for immunization: Secondary | ICD-10-CM | POA: Diagnosis not present

## 2020-08-14 DIAGNOSIS — G4733 Obstructive sleep apnea (adult) (pediatric): Secondary | ICD-10-CM | POA: Diagnosis not present

## 2020-08-14 DIAGNOSIS — G629 Polyneuropathy, unspecified: Secondary | ICD-10-CM | POA: Diagnosis not present

## 2020-08-14 DIAGNOSIS — I340 Nonrheumatic mitral (valve) insufficiency: Secondary | ICD-10-CM | POA: Diagnosis not present

## 2020-08-14 DIAGNOSIS — N400 Enlarged prostate without lower urinary tract symptoms: Secondary | ICD-10-CM | POA: Diagnosis not present

## 2020-08-14 DIAGNOSIS — M5117 Intervertebral disc disorders with radiculopathy, lumbosacral region: Secondary | ICD-10-CM | POA: Diagnosis not present

## 2020-08-14 DIAGNOSIS — I10 Essential (primary) hypertension: Secondary | ICD-10-CM | POA: Diagnosis not present

## 2020-08-14 DIAGNOSIS — E1140 Type 2 diabetes mellitus with diabetic neuropathy, unspecified: Secondary | ICD-10-CM | POA: Diagnosis not present

## 2020-12-11 ENCOUNTER — Other Ambulatory Visit: Payer: Self-pay | Admitting: Internal Medicine

## 2020-12-11 DIAGNOSIS — G4733 Obstructive sleep apnea (adult) (pediatric): Secondary | ICD-10-CM | POA: Diagnosis not present

## 2020-12-11 DIAGNOSIS — M5117 Intervertebral disc disorders with radiculopathy, lumbosacral region: Secondary | ICD-10-CM | POA: Diagnosis not present

## 2020-12-11 DIAGNOSIS — E1140 Type 2 diabetes mellitus with diabetic neuropathy, unspecified: Secondary | ICD-10-CM | POA: Diagnosis not present

## 2020-12-11 DIAGNOSIS — N400 Enlarged prostate without lower urinary tract symptoms: Secondary | ICD-10-CM | POA: Diagnosis not present

## 2020-12-11 DIAGNOSIS — G629 Polyneuropathy, unspecified: Secondary | ICD-10-CM | POA: Diagnosis not present

## 2020-12-11 DIAGNOSIS — M179 Osteoarthritis of knee, unspecified: Secondary | ICD-10-CM | POA: Diagnosis not present

## 2020-12-11 DIAGNOSIS — I340 Nonrheumatic mitral (valve) insufficiency: Secondary | ICD-10-CM | POA: Diagnosis not present

## 2020-12-11 DIAGNOSIS — K219 Gastro-esophageal reflux disease without esophagitis: Secondary | ICD-10-CM | POA: Diagnosis not present

## 2020-12-11 DIAGNOSIS — I10 Essential (primary) hypertension: Secondary | ICD-10-CM | POA: Diagnosis not present

## 2021-02-15 DIAGNOSIS — E1165 Type 2 diabetes mellitus with hyperglycemia: Secondary | ICD-10-CM | POA: Diagnosis not present

## 2021-02-15 DIAGNOSIS — Z125 Encounter for screening for malignant neoplasm of prostate: Secondary | ICD-10-CM | POA: Diagnosis not present

## 2021-02-15 DIAGNOSIS — E785 Hyperlipidemia, unspecified: Secondary | ICD-10-CM | POA: Diagnosis not present

## 2021-02-19 DIAGNOSIS — N400 Enlarged prostate without lower urinary tract symptoms: Secondary | ICD-10-CM | POA: Diagnosis not present

## 2021-02-19 DIAGNOSIS — M1990 Unspecified osteoarthritis, unspecified site: Secondary | ICD-10-CM | POA: Diagnosis not present

## 2021-02-19 DIAGNOSIS — M5117 Intervertebral disc disorders with radiculopathy, lumbosacral region: Secondary | ICD-10-CM | POA: Diagnosis not present

## 2021-02-21 ENCOUNTER — Encounter (HOSPITAL_COMMUNITY): Payer: Self-pay | Admitting: Emergency Medicine

## 2021-02-21 ENCOUNTER — Emergency Department (HOSPITAL_COMMUNITY): Payer: PPO

## 2021-02-21 ENCOUNTER — Other Ambulatory Visit: Payer: Self-pay

## 2021-02-21 ENCOUNTER — Observation Stay (HOSPITAL_COMMUNITY)
Admission: EM | Admit: 2021-02-21 | Discharge: 2021-02-23 | Disposition: A | Discharge status: Home / Self Care | Payer: PPO | Attending: Family Medicine | Admitting: Family Medicine

## 2021-02-21 DIAGNOSIS — I10 Essential (primary) hypertension: Secondary | ICD-10-CM | POA: Diagnosis not present

## 2021-02-21 DIAGNOSIS — Z79899 Other long term (current) drug therapy: Secondary | ICD-10-CM | POA: Diagnosis not present

## 2021-02-21 DIAGNOSIS — N400 Enlarged prostate without lower urinary tract symptoms: Secondary | ICD-10-CM | POA: Diagnosis present

## 2021-02-21 DIAGNOSIS — D649 Anemia, unspecified: Secondary | ICD-10-CM | POA: Diagnosis not present

## 2021-02-21 DIAGNOSIS — R9431 Abnormal electrocardiogram [ECG] [EKG]: Secondary | ICD-10-CM | POA: Diagnosis not present

## 2021-02-21 DIAGNOSIS — R4182 Altered mental status, unspecified: Secondary | ICD-10-CM | POA: Diagnosis not present

## 2021-02-21 DIAGNOSIS — T402X1A Poisoning by other opioids, accidental (unintentional), initial encounter: Principal | ICD-10-CM | POA: Diagnosis present

## 2021-02-21 DIAGNOSIS — Z7982 Long term (current) use of aspirin: Secondary | ICD-10-CM | POA: Insufficient documentation

## 2021-02-21 DIAGNOSIS — G5790 Unspecified mononeuropathy of unspecified lower limb: Secondary | ICD-10-CM | POA: Diagnosis present

## 2021-02-21 DIAGNOSIS — K219 Gastro-esophageal reflux disease without esophagitis: Secondary | ICD-10-CM | POA: Diagnosis present

## 2021-02-21 DIAGNOSIS — T402X1D Poisoning by other opioids, accidental (unintentional), subsequent encounter: Secondary | ICD-10-CM

## 2021-02-21 DIAGNOSIS — Z20822 Contact with and (suspected) exposure to covid-19: Secondary | ICD-10-CM | POA: Diagnosis not present

## 2021-02-21 DIAGNOSIS — E876 Hypokalemia: Secondary | ICD-10-CM | POA: Diagnosis present

## 2021-02-21 DIAGNOSIS — M549 Dorsalgia, unspecified: Secondary | ICD-10-CM | POA: Diagnosis not present

## 2021-02-21 DIAGNOSIS — E871 Hypo-osmolality and hyponatremia: Secondary | ICD-10-CM | POA: Diagnosis not present

## 2021-02-21 DIAGNOSIS — E1165 Type 2 diabetes mellitus with hyperglycemia: Secondary | ICD-10-CM | POA: Diagnosis not present

## 2021-02-21 DIAGNOSIS — Z96653 Presence of artificial knee joint, bilateral: Secondary | ICD-10-CM | POA: Diagnosis not present

## 2021-02-21 DIAGNOSIS — R0602 Shortness of breath: Secondary | ICD-10-CM | POA: Diagnosis not present

## 2021-02-21 DIAGNOSIS — G4730 Sleep apnea, unspecified: Secondary | ICD-10-CM | POA: Diagnosis present

## 2021-02-21 DIAGNOSIS — Z87891 Personal history of nicotine dependence: Secondary | ICD-10-CM | POA: Insufficient documentation

## 2021-02-21 DIAGNOSIS — E785 Hyperlipidemia, unspecified: Secondary | ICD-10-CM | POA: Diagnosis present

## 2021-02-21 DIAGNOSIS — R0681 Apnea, not elsewhere classified: Secondary | ICD-10-CM | POA: Diagnosis not present

## 2021-02-21 DIAGNOSIS — R404 Transient alteration of awareness: Secondary | ICD-10-CM | POA: Diagnosis not present

## 2021-02-21 DIAGNOSIS — T50901A Poisoning by unspecified drugs, medicaments and biological substances, accidental (unintentional), initial encounter: Secondary | ICD-10-CM

## 2021-02-21 DIAGNOSIS — T50904A Poisoning by unspecified drugs, medicaments and biological substances, undetermined, initial encounter: Secondary | ICD-10-CM | POA: Diagnosis not present

## 2021-02-21 MED ORDER — ONDANSETRON HCL 4 MG/2ML IJ SOLN
4.0000 mg | Freq: Once | INTRAMUSCULAR | Status: AC
  Administered 2021-02-21: 4 mg via INTRAVENOUS
  Filled 2021-02-21: qty 2

## 2021-02-21 MED ORDER — NALOXONE HCL 2 MG/2ML IJ SOSY
2.0000 mg | PREFILLED_SYRINGE | Freq: Once | INTRAMUSCULAR | Status: AC
  Administered 2021-02-21: 2 mg via INTRAVENOUS
  Filled 2021-02-21: qty 2

## 2021-02-21 NOTE — ED Notes (Signed)
Inventoried two bottles of narcotic hydrocodones acetaminophen (4+32=36 tablets) with charge RN Janett Billow F. Secured in bag.   On wife arrival, Lenn Volker, Pt requested medications be given to her. Medication in bag given to wife. NT [place] at bedside

## 2021-02-21 NOTE — ED Provider Notes (Signed)
[name] EMERGENCY DEPARTMENT Provider Note   CSN: [id number] Arrival date & time: 02/21/21  2130     History No chief complaint on file.   [name] is a 75 y.o. male.  HPI EMS called out for nonresponsiveness and arrived on scene to find a 29 YOM receiving bystander CPR. The patient's son stated that he had been drinking and took his home pain medications approximately 30 minutes prior. EMS administered a total of 6mg  narcan including 4mg  IM at 2100. Patient responded to the medications and is GCS 15 on arrival AOx4. Patient denies fevers or chills, nausea or vomitting, syncope or SOB.   Past Medical History:  Diagnosis Date  . Anal fissure   . Arthritis    knees, right foot   . BPH (benign prostatic hypertrophy)   . Diverticulosis   . DJD (degenerative joint disease)   . ED (erectile dysfunction)   . GERD (gastroesophageal reflux disease)   . Heart murmur    pt states he " outgrew"   . Hyperlipidemia   . Hypertension   . Internal hemorrhoids   . Neuropathy of foot   . Sleep apnea    does not use machine , uses 02 2l/[place] at nite   . Tubular adenoma of colon 12/2013    There are no problems to display for this patient.   Past Surgical History:  Procedure Laterality Date  . ANAL FISSURE REPAIR    . FOOT FRACTURE SURGERY    . JOINT REPLACEMENT     left knee replacement   . TOTAL KNEE ARTHROPLASTY  12/12/2011   Procedure: TOTAL KNEE ARTHROPLASTY;  Surgeon: Cynda Familia, MD;  Location: [facility];  Service: Orthopedics;  Laterality: Right;       Family History  Problem Relation Age of Onset  . Heart disease Mother   . Heart disease Father   . Heart disease Brother   . Colon cancer Maternal Grandmother     Social History   Tobacco Use  . Smoking status: Former Smoker    Types: Cigarettes    Quit date: 11/17/1981    Years since quitting: 39.2  . Smokeless tobacco: Former Systems developer    Types: Snuff    Quit date: 11/17/2001  Vaping  Use  . Vaping Use: Never used  Substance Use Topics  . Alcohol use: Yes    Alcohol/week: 8.0 standard drinks    Types: 8 Cans of beer per week  . Drug use: No    Home Medications Prior to Admission medications   Medication Sig Start Date End Date Taking? Authorizing Provider  aspirin 81 MG tablet Take 81 mg by mouth daily.    [provider]  atorvastatin (LIPITOR) 80 MG tablet Take 80 mg by mouth daily.    [provider]  carvedilol (COREG) 6.25 MG tablet Take 6.25 mg by mouth 2 (two) times daily with a meal.    [provider]  cholecalciferol (VITAMIN D) 1000 UNITS tablet Take 1,000 Units by mouth daily with breakfast.    [provider]  DULoxetine (CYMBALTA) 60 MG capsule Take 60 mg by mouth daily.    [provider]  Ezetimibe (ZETIA PO) Take 2.5 mg by mouth every other day.    [provider]  fish oil-omega-3 fatty acids 1000 MG capsule Take 1 g by mouth daily with breakfast.     [provider]  gabapentin (NEURONTIN) 300 MG capsule Take 600 mg by mouth 3 (  three) times daily.     [provider]  glipiZIDE (GLUCOTROL XL) 2.5 MG 24 hr tablet Take 2.5 mg by mouth 2 (two) times daily.    [provider]  Glucosamine-Chondroitin (OSTEO BI-FLEX REGULAR STRENGTH PO) Take 1,000 mg by mouth.    [provider]  lisinopril-hydrochlorothiazide (PRINZIDE,ZESTORETIC) 20-12.5 MG tablet Take 1 tablet by mouth daily.    [provider]  metFORMIN (GLUCOPHAGE) 500 MG tablet Take 1,000 mg by mouth 2 (two) times daily with a meal.    [provider]  omeprazole (PRILOSEC) 20 MG capsule Take 20 mg by mouth daily.    [provider]  prazosin (MINIPRESS) 5 MG capsule Take 5 mg by mouth at bedtime.    [provider]  venlafaxine (EFFEXOR) 37.5 MG tablet Take 37.5 mg by mouth 2 (two) times daily.    [provider]  vitamin B-12 (CYANOCOBALAMIN) 1000 MCG tablet Take  2,000 mcg by mouth daily with breakfast.     [provider]    Allergies    Patient has no known allergies.  Review of Systems   Review of Systems  Constitutional: Negative for chills and fever.  HENT: Negative for ear pain and sore throat.   Eyes: Negative for pain and visual disturbance.  Respiratory: Negative for cough and shortness of breath.   Cardiovascular: Negative for chest pain and palpitations.  Gastrointestinal: Negative for abdominal pain and vomiting.  Genitourinary: Negative for dysuria and hematuria.  Musculoskeletal: Negative for arthralgias and back pain.  Skin: Negative for color change and rash.  Neurological: Negative for seizures and syncope.  All other systems reviewed and are negative.   Physical Exam Updated Vital Signs There were no vitals taken for this visit.  Physical Exam Vitals and nursing note reviewed.  Constitutional:      Appearance: He is well-developed.  HENT:     Head: Normocephalic and atraumatic.  Eyes:     Conjunctiva/sclera: Conjunctivae normal.  Cardiovascular:     Rate and Rhythm: Normal rate and regular rhythm.     Heart sounds: No murmur heard.   Pulmonary:     Effort: Pulmonary effort is normal. No respiratory distress.     Breath sounds: Normal breath sounds.  Abdominal:     Palpations: Abdomen is soft.     Tenderness: There is no abdominal tenderness.  Musculoskeletal:     Cervical back: Neck supple.  Skin:    General: Skin is warm and dry.  Neurological:     Mental Status: He is alert.     ED Course  I have reviewed the triage vital signs and the nursing notes.  Pertinent labs & imaging results that were available during my care of the patient were reviewed by me and considered in my medical decision making (see chart for details).    MDM Rules/Calculators/A&P Patient's HPI and PE findings are most consistent with narcotic overdose in combination with ETOH intoxication. Patient with notable, robust  response to narcan administration but gets sleepy again about 2 hours following each dose administration. Patient's screening labs for underlying abnormality without acute etiology. Multiple borderline electrolytes repleted as appropriate but given continued borderline AMS, patient will require admission for further evaluation and management.    Final Clinical Impression(s) / ED Diagnoses Final diagnoses:  None    Rx / DC Orders ED Discharge Orders    None       Tretha Sciara, MD 02/22/21 3953    Varney Biles, MD 02/23/21  1602  

## 2021-02-21 NOTE — ED Triage Notes (Signed)
Pt BIB [facility] c/o overdose. Initial call out was CPR. Son at home initiated CPR for about 5 min. On EMS arrival pt with pulses and apenic. Given 4 mg narcan IM and 2 mg narcan IV. Found with two bottles of hydrocodone-acetaminophen.  Pt destated to 88% in triage placed on 2 L

## 2021-02-21 NOTE — ED Notes (Signed)
Wife would like to get updated at [phone].

## 2021-02-21 NOTE — ED Notes (Signed)
Able to speak with son Marland Kitchen and gave him and update.

## 2021-02-21 NOTE — ED Notes (Signed)
Pt placed on 2L due to O2 being 89

## 2021-02-21 NOTE — ED Notes (Signed)
[name] [phone] would like an update when possible

## 2021-02-21 NOTE — ED Notes (Addendum)
Pt continues to destat SpO2 88% on 2 L of supplemental oxygen. Pt normally on room air. Currently requiring 4 L of oxygen to maintain SpO2 >90%. Given IV narcan.

## 2021-02-22 ENCOUNTER — Encounter (HOSPITAL_COMMUNITY): Payer: Self-pay | Admitting: Internal Medicine

## 2021-02-22 ENCOUNTER — Emergency Department (HOSPITAL_COMMUNITY): Payer: PPO

## 2021-02-22 DIAGNOSIS — E785 Hyperlipidemia, unspecified: Secondary | ICD-10-CM | POA: Diagnosis present

## 2021-02-22 DIAGNOSIS — T402X1A Poisoning by other opioids, accidental (unintentional), initial encounter: Secondary | ICD-10-CM | POA: Diagnosis not present

## 2021-02-22 DIAGNOSIS — I10 Essential (primary) hypertension: Secondary | ICD-10-CM | POA: Diagnosis present

## 2021-02-22 DIAGNOSIS — E876 Hypokalemia: Secondary | ICD-10-CM | POA: Diagnosis present

## 2021-02-22 DIAGNOSIS — K219 Gastro-esophageal reflux disease without esophagitis: Secondary | ICD-10-CM | POA: Diagnosis present

## 2021-02-22 DIAGNOSIS — R0602 Shortness of breath: Secondary | ICD-10-CM | POA: Diagnosis not present

## 2021-02-22 DIAGNOSIS — D649 Anemia, unspecified: Secondary | ICD-10-CM | POA: Diagnosis present

## 2021-02-22 DIAGNOSIS — E871 Hypo-osmolality and hyponatremia: Secondary | ICD-10-CM | POA: Diagnosis present

## 2021-02-22 DIAGNOSIS — G5790 Unspecified mononeuropathy of unspecified lower limb: Secondary | ICD-10-CM | POA: Diagnosis present

## 2021-02-22 DIAGNOSIS — N400 Enlarged prostate without lower urinary tract symptoms: Secondary | ICD-10-CM | POA: Diagnosis present

## 2021-02-22 DIAGNOSIS — G4730 Sleep apnea, unspecified: Secondary | ICD-10-CM | POA: Diagnosis present

## 2021-02-22 LAB — GLUCOSE, CAPILLARY
Glucose-Capillary: 186 mg/dL — ABNORMAL HIGH (ref 70–99)
Glucose-Capillary: 164 mg/dL — ABNORMAL HIGH (ref 70–99)
Glucose-Capillary: 151 mg/dL — ABNORMAL HIGH (ref 70–99)
Glucose-Capillary: 130 mg/dL — ABNORMAL HIGH (ref 70–99)
Glucose-Capillary: 186 mg/dL — ABNORMAL HIGH (ref 70–99)

## 2021-02-22 LAB — CBC WITH DIFFERENTIAL/PLATELET
Abs Immature Granulocytes: 0.03 10*3/uL (ref 0.00–0.07)
Basophils Absolute: 0 10*3/uL (ref 0.0–0.1)
Basophils Relative: 0 %
Eosinophils Absolute: 0 10*3/uL (ref 0.0–0.5)
Eosinophils Relative: 1 %
HCT: 33 % — ABNORMAL LOW (ref 39.0–52.0)
Hemoglobin: 10.7 g/dL — ABNORMAL LOW (ref 13.0–17.0)
Immature Granulocytes: 0 %
Lymphocytes Relative: 12 %
Lymphs Abs: 0.8 10*3/uL (ref 0.7–4.0)
MCH: 29.6 pg (ref 26.0–34.0)
MCHC: 32.4 g/dL (ref 30.0–36.0)
MCV: 91.2 fL (ref 80.0–100.0)
Monocytes Absolute: 0.8 10*3/uL (ref 0.1–1.0)
Monocytes Relative: 11 %
Neutro Abs: 5.5 10*3/uL (ref 1.7–7.7)
Neutrophils Relative %: 76 %
Platelets: 209 10*3/uL (ref 150–400)
RBC: 3.62 MIL/uL — ABNORMAL LOW (ref 4.22–5.81)
RDW: 14.5 % (ref 11.5–15.5)
WBC: 7.2 10*3/uL (ref 4.0–10.5)
nRBC: 0 % (ref 0.0–0.2)

## 2021-02-22 LAB — IRON AND TIBC
Iron: 44 ug/dL — ABNORMAL LOW (ref 45–182)
Saturation Ratios: 10 % — ABNORMAL LOW (ref 17.9–39.5)
TIBC: 455 ug/dL — ABNORMAL HIGH (ref 250–450)
UIBC: 411 ug/dL

## 2021-02-22 LAB — FERRITIN: Ferritin: 13 ng/mL — ABNORMAL LOW (ref 24–336)

## 2021-02-22 LAB — BASIC METABOLIC PANEL
Anion gap: 13 (ref 5–15)
BUN: 14 mg/dL (ref 8–23)
CO2: 27 mmol/L (ref 22–32)
Calcium: 9 mg/dL (ref 8.9–10.3)
Chloride: 90 mmol/L — ABNORMAL LOW (ref 98–111)
Creatinine, Ser: 0.86 mg/dL (ref 0.61–1.24)
GFR, Estimated: >60 mL/min (ref >60)
Glucose, Bld: 191 mg/dL — ABNORMAL HIGH (ref 70–99)
Potassium: 3.3 mmol/L — ABNORMAL LOW (ref 3.5–5.1)
Sodium: 130 mmol/L — ABNORMAL LOW (ref 135–145)

## 2021-02-22 LAB — ETHANOL: Alcohol, Ethyl (B): <10 mg/dL (ref <10)

## 2021-02-22 LAB — COMPREHENSIVE METABOLIC PANEL
ALT: 15 U/L (ref 0–44)
AST: 22 U/L (ref 15–41)
Albumin: 3.6 g/dL (ref 3.5–5.0)
Alkaline Phosphatase: 53 U/L (ref 38–126)
Anion gap: 17 — ABNORMAL HIGH (ref 5–15)
BUN: 13 mg/dL (ref 8–23)
CO2: 18 mmol/L — ABNORMAL LOW (ref 22–32)
Calcium: 8.6 mg/dL — ABNORMAL LOW (ref 8.9–10.3)
Chloride: 92 mmol/L — ABNORMAL LOW (ref 98–111)
Creatinine, Ser: 0.91 mg/dL (ref 0.61–1.24)
GFR, Estimated: >60 mL/min (ref >60)
Glucose, Bld: 248 mg/dL — ABNORMAL HIGH (ref 70–99)
Potassium: 2.9 mmol/L — ABNORMAL LOW (ref 3.5–5.1)
Sodium: 127 mmol/L — ABNORMAL LOW (ref 135–145)
Total Bilirubin: 0.4 mg/dL (ref 0.3–1.2)
Total Protein: 6.2 g/dL — ABNORMAL LOW (ref 6.5–8.1)

## 2021-02-22 LAB — VITAMIN B12: Vitamin B-12: 255 pg/mL (ref 180–914)

## 2021-02-22 LAB — MRSA PCR SCREENING

## 2021-02-22 LAB — RETICULOCYTES
Immature Retic Fract: 16.4 % — ABNORMAL HIGH (ref 2.3–15.9)
RBC.: 3.51 MIL/uL — ABNORMAL LOW (ref 4.22–5.81)
Retic Count, Absolute: 73.4 10*3/uL (ref 19.0–186.0)
Retic Ct Pct: 2.1 % (ref 0.4–3.1)

## 2021-02-22 LAB — HEMOGLOBIN A1C
Hgb A1c MFr Bld: 7.5 % — ABNORMAL HIGH (ref 4.8–5.6)
Mean Plasma Glucose: 168.55 mg/dL

## 2021-02-22 LAB — PHOSPHORUS: Phosphorus: 4.2 mg/dL (ref 2.5–4.6)

## 2021-02-22 LAB — FOLATE: Folate: 17.4 ng/mL (ref >5.9)

## 2021-02-22 LAB — MAGNESIUM: Magnesium: 1.5 mg/dL — ABNORMAL LOW (ref 1.7–2.4)

## 2021-02-22 MED ORDER — ONDANSETRON HCL 4 MG/2ML IJ SOLN: 4.0000 mg | Freq: Four times a day (QID) | INTRAMUSCULAR | Status: DC | PRN

## 2021-02-22 MED ORDER — POTASSIUM CHLORIDE 10 MEQ/100ML IV SOLN
10.0000 meq | Freq: Once | INTRAVENOUS | Status: AC
  Administered 2021-02-22: 10 meq via INTRAVENOUS
  Filled 2021-02-22: qty 100

## 2021-02-22 MED ORDER — NALOXONE HCL 2 MG/2ML IJ SOSY
1.0000 mg | PREFILLED_SYRINGE | INTRAMUSCULAR | Status: DC | PRN
  Filled 2021-02-22: qty 2

## 2021-02-22 MED ORDER — ACETAMINOPHEN 325 MG PO TABS: 650.0000 mg | ORAL_TABLET | Freq: Four times a day (QID) | ORAL | Status: DC | PRN

## 2021-02-22 MED ORDER — HYDROCHLOROTHIAZIDE 12.5 MG PO CAPS
12.5000 mg | ORAL_CAPSULE | Freq: Every day | ORAL | Status: DC
  Administered 2021-02-23: 12.5 mg via ORAL
  Filled 2021-02-22 (×2): qty 1

## 2021-02-22 MED ORDER — LISINOPRIL-HYDROCHLOROTHIAZIDE 20-12.5 MG PO TABS: 1.0000 | ORAL_TABLET | Freq: Every day | ORAL | Status: DC

## 2021-02-22 MED ORDER — POTASSIUM CHLORIDE IN NACL 20-0.9 MEQ/L-% IV SOLN
INTRAVENOUS | Status: DC
  Filled 2021-02-22: qty 1000

## 2021-02-22 MED ORDER — ACETAMINOPHEN 650 MG RE SUPP: 650.0000 mg | Freq: Four times a day (QID) | RECTAL | Status: DC | PRN

## 2021-02-22 MED ORDER — POTASSIUM CHLORIDE IN NACL 20-0.9 MEQ/L-% IV SOLN
INTRAVENOUS | Status: AC
  Filled 2021-02-22 (×2): qty 1000

## 2021-02-22 MED ORDER — PRAZOSIN HCL 2 MG PO CAPS
5.0000 mg | ORAL_CAPSULE | Freq: Every day | ORAL | Status: DC
  Administered 2021-02-22: 5 mg via ORAL
  Filled 2021-02-22 (×3): qty 1

## 2021-02-22 MED ORDER — CARVEDILOL 6.25 MG PO TABS
6.2500 mg | ORAL_TABLET | Freq: Two times a day (BID) | ORAL | Status: DC
  Administered 2021-02-22 – 2021-02-23 (×2): 6.25 mg via ORAL
  Filled 2021-02-22 (×2): qty 1

## 2021-02-22 MED ORDER — VITAMIN D 25 MCG (1000 UNIT) PO TABS
1000.0000 [IU] | ORAL_TABLET | Freq: Every day | ORAL | Status: DC
  Administered 2021-02-23: 1000 [IU] via ORAL
  Filled 2021-02-22 (×2): qty 1

## 2021-02-22 MED ORDER — EZETIMIBE 10 MG PO TABS
10.0000 mg | ORAL_TABLET | Freq: Every day | ORAL | Status: DC
  Administered 2021-02-22 – 2021-02-23 (×2): 10 mg via ORAL
  Filled 2021-02-22 (×2): qty 1

## 2021-02-22 MED ORDER — NALOXONE HCL 0.4 MG/ML IJ SOLN: 0.4000 mg | INTRAMUSCULAR | Status: DC | PRN

## 2021-02-22 MED ORDER — ENOXAPARIN SODIUM 40 MG/0.4ML ~~LOC~~ SOLN
40.0000 mg | Freq: Every day | SUBCUTANEOUS | Status: DC
  Administered 2021-02-22: 40 mg via SUBCUTANEOUS
  Filled 2021-02-22 (×2): qty 0.4

## 2021-02-22 MED ORDER — ONDANSETRON HCL 4 MG PO TABS: 4.0000 mg | ORAL_TABLET | Freq: Four times a day (QID) | ORAL | Status: DC | PRN

## 2021-02-22 MED ORDER — ASPIRIN EC 81 MG PO TBEC
81.0000 mg | DELAYED_RELEASE_TABLET | ORAL | Status: DC
  Administered 2021-02-22: 81 mg via ORAL
  Filled 2021-02-22: qty 1

## 2021-02-22 MED ORDER — INSULIN ASPART 100 UNIT/ML ~~LOC~~ SOLN
0.0000 [IU] | Freq: Three times a day (TID) | SUBCUTANEOUS | Status: DC
  Administered 2021-02-22: 2 [IU] via SUBCUTANEOUS
  Administered 2021-02-22 (×2): 3 [IU] via SUBCUTANEOUS
  Administered 2021-02-23: 2 [IU] via SUBCUTANEOUS

## 2021-02-22 MED ORDER — LISINOPRIL 20 MG PO TABS
20.0000 mg | ORAL_TABLET | Freq: Every day | ORAL | Status: DC
  Administered 2021-02-23: 20 mg via ORAL
  Filled 2021-02-22 (×2): qty 1

## 2021-02-22 MED ORDER — VENLAFAXINE HCL 37.5 MG PO TABS
37.5000 mg | ORAL_TABLET | Freq: Two times a day (BID) | ORAL | Status: DC
  Administered 2021-02-22 – 2021-02-23 (×3): 37.5 mg via ORAL
  Filled 2021-02-22 (×4): qty 1

## 2021-02-22 MED ORDER — DULOXETINE HCL 60 MG PO CPEP
60.0000 mg | ORAL_CAPSULE | Freq: Every day | ORAL | Status: DC
  Administered 2021-02-22 – 2021-02-23 (×2): 60 mg via ORAL
  Filled 2021-02-22 (×2): qty 1

## 2021-02-22 MED ORDER — POTASSIUM CHLORIDE CRYS ER 20 MEQ PO TBCR
40.0000 meq | EXTENDED_RELEASE_TABLET | Freq: Once | ORAL | Status: AC
  Administered 2021-02-22: 40 meq via ORAL
  Filled 2021-02-22: qty 2

## 2021-02-22 MED ORDER — ATORVASTATIN CALCIUM 80 MG PO TABS
80.0000 mg | ORAL_TABLET | Freq: Every day | ORAL | Status: DC
  Administered 2021-02-22 – 2021-02-23 (×2): 80 mg via ORAL
  Filled 2021-02-22 (×2): qty 1

## 2021-02-22 NOTE — Social Work (Signed)
CSW received consult for substance use resources. CSW spoke with patient at bedside. CSW offered patient outpatient substance use treatment services. Patient declined. CSW will continue to follow.

## 2021-02-22 NOTE — Progress Notes (Signed)
75 year old very pleasant patient with a history of anal fissure, internal hemorrhoids, tubular adenoma, diverticulosis was brought into the emergency department due to opioid overdose as well as alcohol intoxication.  He was given couple of doses of naloxone at the scene and in route.  Which helped significantly.  Patient seen and examined this morning.  He is slightly sleepy but much more alert and oriented.  He tells me that he took 5 opioid tablets back-to-back due to continued back pain.  He cannot recall the name.  His med rec also does not have any opioid at this point in time.  He also drinks 6 beers/12 ounces every day since last 20 years.  Currently he has no complaints.  Just lethargic.  Hemodynamically stable.  We will monitor him closely for next 24 hours.  Patient's primary RN was with me during my encounter.  I advised RN to walk the patient.  If needed, consult PT OT.Hemoglobin A1c 7.5.  Will hold oral hypoglycemic agents.  Continue SSI.  Potassium 3.3.  Potassium replaced.  Blood pressure slightly elevated.  Will resume home antihypertensive medications.

## 2021-02-22 NOTE — H&P (Signed)
History and Physical    [name] ZJI:[id number] DOB: Mar 25, 1946 DOA: 02/21/2021  PCP: Crist Infante, MD  Patient coming from: Home.   I have personally briefly reviewed patient's old medical records in [place]  Chief Complaint: Hydrocodone overdose.   HPI: [name] is a 75 y.o. male with medical history significant of anal fissure, internal hemorrhoids, tubular adenoma, diverticulosis, osteoarthritis of the knees and foot, BPH, erectile dysfunction, GERD, hyperlipidemia, hypertension, neuropathy right foot, sleep apnea not on CPAP who was brought to the emergency department via EMS after he found at home by his son unresponsive.  The patient states that he had a new prescription for hydrocodone due to back and arthritic pain.  He used a total of 6 tablets earlier in the day.  He then drank 6 beers.  He does not remember why happening afterwards.  His son called EMS and started CPR.  EMS arrived and noticed that the patient still had a pulse.  They provided naloxone 4 mg IM with immediate results.  He had to receive another 2 mg IVP on the way to the emergency department.  He denies fever, chills, sore throat, rhinorrhea, wheezing or hemoptysis.  He has chest soreness from receiving CPR, but no typical CP, palpitations, diaphoresis, PND, orthopnea or pitting edema of the lower extremities.  Denies abdominal pain, nausea, emesis, diarrhea, constipation, melena or hematochezia.  No dysuria, frequency or materia.  No polyuria, polydipsia, polyphagia or blurred vision.  ED Course: Initial vital signs were temperature 97.9 F, pulse 92, respirations 26, BP 137/94 mmHg O2 sat 92% on 2 LPM via [place].  The patient received 2 mg of naloxone and 4 mg of ondansetron IVP x1 dose.  Lab work: CBC shows a white count 7.2, hemoglobin 10.7 g/dL and platelets 209.  Sodium 127, potassium 2.9, chloride 92 and CO2 18 mmol/L.  Gap is 17.  Glucose 200 [place] and calcium 8.6 mg/dL.  LFTs were normal,  except for a total protein of 6.2 g/dL.  Imaging: A one-view portable chest radiograph showed cardiomegaly, but did not show any cardiopulmonary process.  CT head without contrast did not show any acute intracranial normality.  Please see images and full radiology report for further detail.  Review of Systems: As per HPI otherwise all other systems reviewed and are negative.  Past Medical History:  Diagnosis Date  . Anal fissure   . Arthritis    knees, right foot   . BPH (benign prostatic hypertrophy)   . Diverticulosis   . DJD (degenerative joint disease)   . ED (erectile dysfunction)   . GERD (gastroesophageal reflux disease)   . Heart murmur    pt states he " outgrew"   . Hyperlipidemia   . Hypertension   . Internal hemorrhoids   . Neuropathy of foot   . Sleep apnea    does not use machine , uses 02 2l/[place] at nite   . Tubular adenoma of colon 12/2013    Past Surgical History:  Procedure Laterality Date  . ANAL FISSURE REPAIR    . FOOT FRACTURE SURGERY    . JOINT REPLACEMENT     left knee replacement   . TOTAL KNEE ARTHROPLASTY  12/12/2011   Procedure: TOTAL KNEE ARTHROPLASTY;  Surgeon: Cynda Familia, MD;  Location: [facility];  Service: Orthopedics;  Laterality: Right;    Social History  reports that he quit smoking about 39 years ago. His smoking use included cigarettes. He quit smokeless tobacco use  about 19 years ago.  His smokeless tobacco use included snuff. He reports current alcohol use of about 8.0 standard drinks of alcohol per week. He reports that he does not use drugs.  No Known Allergies  Family History  Problem Relation Age of Onset  . Heart disease Mother   . Heart disease Father   . Heart disease Brother   . Colon cancer Maternal Grandmother    Prior to Admission medications   Medication Sig Start Date End Date Taking? Authorizing Provider  aspirin 81 MG tablet Take 81 mg by mouth daily.    [provider]  atorvastatin (LIPITOR) 80 MG  tablet Take 80 mg by mouth daily.    [provider]  carvedilol (COREG) 6.25 MG tablet Take 6.25 mg by mouth 2 (two) times daily with a meal.    [provider]  cholecalciferol (VITAMIN D) 1000 UNITS tablet Take 1,000 Units by mouth daily with breakfast.    [provider]  DULoxetine (CYMBALTA) 60 MG capsule Take 60 mg by mouth daily.    [provider]  Ezetimibe (ZETIA PO) Take 2.5 mg by mouth every other day.    [provider]  fish oil-omega-3 fatty acids 1000 MG capsule Take 1 g by mouth daily with breakfast.     [provider]  gabapentin (NEURONTIN) 300 MG capsule Take 600 mg by mouth 3 (three) times daily.     [provider]  glipiZIDE (GLUCOTROL XL) 2.5 MG 24 hr tablet Take 2.5 mg by mouth 2 (two) times daily.    [provider]  Glucosamine-Chondroitin (OSTEO BI-FLEX REGULAR STRENGTH PO) Take 1,000 mg by mouth.    [provider]  lisinopril-hydrochlorothiazide (PRINZIDE,ZESTORETIC) 20-12.5 MG tablet Take 1 tablet by mouth daily.    [provider]  metFORMIN (GLUCOPHAGE) 500 MG tablet Take 1,000 mg by mouth 2 (two) times daily with a meal.    [provider]  omeprazole (PRILOSEC) 20 MG capsule Take 20 mg by mouth daily.    [provider]  prazosin (MINIPRESS) 5 MG capsule Take 5 mg by mouth at bedtime.    [provider]  venlafaxine (EFFEXOR) 37.5 MG tablet Take 37.5 mg by mouth 2 (two) times daily.    [provider]  vitamin B-12 (CYANOCOBALAMIN) 1000 MCG tablet Take 2,000 mcg by mouth daily with breakfast.     [provider]    Physical Exam: Vitals:   02/22/21 0045 02/22/21 0100 02/22/21 0130 02/22/21 0145  BP: (!) 148/86 (!) 151/82 (!) 152/79 (!) 148/83  Pulse: 73 72 77 74  Resp: 16 13 13 16   Temp:      TempSrc:      SpO2: 97% 96% 96% 95%  Weight:      Height:        Constitutional: NAD, calm, comfortable Eyes: PERRL, sclerae,  lids and conjunctivae mildly injected. ENMT: Mucous membranes are moist. Posterior pharynx clear of any exudate or lesions. Neck: normal, supple, no masses, no thyromegaly Respiratory: clear to auscultation bilaterally, no wheezing, no crackles. Normal respiratory effort. No accessory muscle use.  Cardiovascular: Regular rate and rhythm, no murmurs / rubs / gallops. No extremity edema. 2+ pedal pulses. No carotid bruits.  Abdomen: Obese, no distention.  Bowel sounds positive.  Soft, no tenderness, no masses palpated. No hepatosplenomegaly. Musculoskeletal: no clubbing / cyanosis. Good ROM, no contractures. Normal muscle tone.  Skin: no obvious acute rashes, lesions, ulcers on very limited dermatological examination. Neurologic: CN  2-12 grossly intact. Sensation intact, DTR normal. Strength 5/5 in all 4.  Psychiatric: Normal judgment and insight. Alert and oriented x 3. Normal mood.   Labs on Admission: I have personally reviewed following labs and imaging studies  CBC: Recent Labs  Lab 02/21/21 2158  WBC 7.2  NEUTROABS 5.5  HGB 10.7*  HCT 33.0*  MCV 91.2  PLT 010    Basic Metabolic Panel: Recent Labs  Lab 02/21/21 2158  NA 127*  K 2.9*  CL 92*  CO2 18*  GLUCOSE 248*  BUN 13  CREATININE 0.91  CALCIUM 8.6*    GFR: Estimated Creatinine Clearance: 88.8 mL/min (by C-G formula based on SCr of 0.91 mg/dL).  Liver Function Tests: Recent Labs  Lab 02/21/21 2158  AST 22  ALT 15  ALKPHOS 53  BILITOT 0.4  PROT 6.2*  ALBUMIN 3.6   Radiological Exams on Admission: CT Head Wo Contrast  Result Date: 02/21/2021 CLINICAL DATA:  Drug overdose, altered mental status EXAM: CT HEAD WITHOUT CONTRAST TECHNIQUE: Contiguous axial images were obtained from the base of the skull through the vertex without intravenous contrast. COMPARISON:  None. FINDINGS: Brain: No acute intracranial abnormality. Specifically, no hemorrhage, hydrocephalus, mass lesion, acute infarction, or significant  intracranial injury. Mild age related volume loss. Vascular: No hyperdense vessel or unexpected calcification. Skull: No acute calvarial abnormality. Sinuses/Orbits: Coastal thickening throughout the paranasal sinuses. No air-fluid levels. Other: None IMPRESSION: No acute intracranial abnormality. Electronically Signed   By: Rolm Baptise M.D.   On: 02/21/2021 22:45   DG Chest Portable 1 View  Result Date: 02/22/2021 CLINICAL DATA:  Shortness of breath. EXAM: PORTABLE CHEST 1 VIEW COMPARISON:  December 10, 2011 FINDINGS: The cardiomediastinal silhouette is enlarged. There is no pneumothorax. No significant pleural effusion. There are likely aortic calcifications. No definite acute displaced fracture. IMPRESSION: 1. No acute cardiopulmonary process. 2. Enlargement of the cardiac silhouette. Electronically Signed   By: Constance Holster M.D.   On: 02/22/2021 01:52    EKG: Independently reviewed.  Vent. rate 73 BPM PR interval 168 ms QRS duration 99 ms QT/QTcB 447/493 ms P-R-T axes -50 25 62 Sinus or ectopic atrial rhythm Atrial premature complexes Abnormal R-wave progression, early transition Borderline prolonged QT interval  Assessment/Plan Principal Problem:   Opioid overdose (HCC) Observation/progressive unit. Time-limited IV fluids. Monitor O2 sat. Monitor ET CO2. Neurochecks every 4 hours. Naloxone as needed. Consult TOC letter  Active Problems: He does not remember names of his meds at this time.   Hypokalemia Replacing. Check magnesium level. Check phosphorus level. Follow potassium level.    Hyponatremia Secondary to diuretic use. Hemodilution from beer consumption. Continue normal saline infusion. Hold hydrochlorothiazide Follow-up sodium level.    Normocytic anemia Check anemia panel. Monitor H&H.    Sleep apnea Declined CPAP. Continue supplemental oxygen.    Benign prostatic hyperplasia Resume processing after med rec done.    Hyperlipidemia On  atorvastatin. Check LFTs given history of EtOH use.    Hypertension Continue carvedilol 6.25 mg p.o. twice daily. Continue lisinopril 20 mg p.o. daily. Hold hydrochlorothiazide due to hyponatremia/hypokalemia.    GERD (gastroesophageal reflux disease) Continue PPI.    Neuropathy of foot On duloxetine and gabapentin. Continue after med rec performed.      DVT prophylaxis: Lovenox SQ. Code Status:   Full code. Family Communication: Disposition Plan:   Patient is from:  Home.  Anticipated DC to:  Home.  Anticipated DC date:  02/22/2021 or 02/23/2021.  Anticipated DC barriers: Clinical status.  Consults called: Admission status:  Observation/progressive unit.  Severity of Illness:  High severity due to accidental hydrocodone overdose in the setting of alcohol intoxication.  The patient will need to remain in the hospital for close monitoring and as needed naloxone administration.  Reubin Milan MD [facility]  How to contact the [facility] Attending or Consulting provider [place] or covering provider during after hours [place], for this patient?   1. Check the care team in [facility] and look for a) attending/consulting [facility] provider listed and b) the [facility] team listed 2. Log into [email or web address] and use [place]'s universal password to access. If you do not have the password, please contact the hospital operator. 3. Locate the [facility] provider you are looking for under [facility] and page to a number that you can be directly reached. 4. If you still have difficulty reaching the provider, please page the [facility] (Director on Call) for the Hospitalists listed on amion for assistance.  02/22/2021, 2:21 AM   This document was prepared using Dragon voice recognition software and may contain some unintended transcription errors.

## 2021-02-23 DIAGNOSIS — T402X1D Poisoning by other opioids, accidental (unintentional), subsequent encounter: Secondary | ICD-10-CM

## 2021-02-23 DIAGNOSIS — T402X1A Poisoning by other opioids, accidental (unintentional), initial encounter: Secondary | ICD-10-CM | POA: Diagnosis not present

## 2021-02-23 LAB — CBC
HCT: 28.9 % — ABNORMAL LOW (ref 39.0–52.0)
Hemoglobin: 9.5 g/dL — ABNORMAL LOW (ref 13.0–17.0)
MCH: 29.4 pg (ref 26.0–34.0)
MCHC: 32.9 g/dL (ref 30.0–36.0)
MCV: 89.5 fL (ref 80.0–100.0)
Platelets: 196 10*3/uL (ref 150–400)
RBC: 3.23 MIL/uL — ABNORMAL LOW (ref 4.22–5.81)
RDW: 14.7 % (ref 11.5–15.5)
WBC: 6 10*3/uL (ref 4.0–10.5)
nRBC: 0 % (ref 0.0–0.2)

## 2021-02-23 LAB — COMPREHENSIVE METABOLIC PANEL
ALT: 12 U/L (ref 0–44)
AST: 14 U/L — ABNORMAL LOW (ref 15–41)
Albumin: 3.3 g/dL — ABNORMAL LOW (ref 3.5–5.0)
Alkaline Phosphatase: 50 U/L (ref 38–126)
Anion gap: 8 (ref 5–15)
BUN: 14 mg/dL (ref 8–23)
CO2: 28 mmol/L (ref 22–32)
Calcium: 8.8 mg/dL — ABNORMAL LOW (ref 8.9–10.3)
Chloride: 98 mmol/L (ref 98–111)
Creatinine, Ser: 0.8 mg/dL (ref 0.61–1.24)
GFR, Estimated: >60 mL/min (ref >60)
Glucose, Bld: 157 mg/dL — ABNORMAL HIGH (ref 70–99)
Potassium: 3.4 mmol/L — ABNORMAL LOW (ref 3.5–5.1)
Sodium: 134 mmol/L — ABNORMAL LOW (ref 135–145)
Total Bilirubin: 0.4 mg/dL (ref 0.3–1.2)
Total Protein: 5.6 g/dL — ABNORMAL LOW (ref 6.5–8.1)

## 2021-02-23 LAB — SARS CORONAVIRUS 2 (TAT 6-24 HRS): SARS Coronavirus 2: NEGATIVE

## 2021-02-23 LAB — GLUCOSE, CAPILLARY: Glucose-Capillary: 148 mg/dL — ABNORMAL HIGH (ref 70–99)

## 2021-02-23 LAB — MAGNESIUM: Magnesium: 1.7 mg/dL (ref 1.7–2.4)

## 2021-02-23 MED ORDER — POTASSIUM CHLORIDE CRYS ER 20 MEQ PO TBCR
40.0000 meq | EXTENDED_RELEASE_TABLET | Freq: Once | ORAL | Status: AC
  Administered 2021-02-23: 40 meq via ORAL
  Filled 2021-02-23: qty 2

## 2021-02-23 MED ORDER — HYDROCHLOROTHIAZIDE 12.5 MG PO CAPS: 12.5000 mg | ORAL_CAPSULE | Freq: Every day | ORAL | Status: DC

## 2021-02-23 MED ORDER — LISINOPRIL-HYDROCHLOROTHIAZIDE 20-12.5 MG PO TABS: 1.0000 | ORAL_TABLET | Freq: Every day | ORAL | Status: DC

## 2021-02-23 MED ORDER — TRAZODONE HCL 50 MG PO TABS
50.0000 mg | ORAL_TABLET | Freq: Once | ORAL | Status: AC
  Administered 2021-02-23: 50 mg via ORAL
  Filled 2021-02-23: qty 1

## 2021-02-23 MED ORDER — LISINOPRIL 20 MG PO TABS: 20.0000 mg | ORAL_TABLET | Freq: Every day | ORAL | Status: DC

## 2021-02-23 NOTE — Discharge Summary (Signed)
Physician Discharge Summary  [name] YCX:[id number] DOB: 14-Apr-1946 DOA: 02/21/2021  PCP: Crist Infante, MD  Admit date: 02/21/2021 Discharge date: 02/23/2021    Admitted From: Home Disposition: Home  Recommendations for Outpatient Follow-up:  1. Follow up with PCP in 1-2 weeks 2. Please obtain BMP/CBC in one week 3. Please follow up with your PCP on the following pending results: Unresulted Labs (From admission, onward)          Start     Ordered   02/23/21 0439  SARS CORONAVIRUS 2 (TAT 6-24 HRS) Nasopharyngeal Nasopharyngeal Swab  (Tier 3 - Symptomatic/asymptomatic with Precautions)  Once,   R       Question Answer Comment  Is this test for diagnosis or screening Screening   Symptomatic for COVID-19 as defined by CDC No   Hospitalized for COVID-19 No   Admitted to ICU for COVID-19 No   Previously tested for COVID-19 No   Resident in a congregate (group) care setting No   Employed in healthcare setting No   Has patient completed COVID vaccination(s) (2 doses of Pfizer/Moderna 1 dose of [employer]) Yes   Has patient completed COVID Booster / 3rd dose Unknown      02/23/21 [place]: None Equipment/Devices: None  Discharge Condition: Stable CODE STATUS: Full code Diet recommendation: Cardiac  Subjective: Seen and examined.  Fully alert.  No complaints.  Excited to go home.  HPI: [name] is a 75 y.o. male with medical history significant of anal fissure, internal hemorrhoids, tubular adenoma, diverticulosis, osteoarthritis of the knees and foot, BPH, erectile dysfunction, GERD, hyperlipidemia, hypertension, neuropathy right foot, sleep apnea not on CPAP who was brought to the emergency department via EMS after he found at home by his son unresponsive.  The patient states that he had a new prescription for hydrocodone due to back and arthritic pain.  He used a total of 6 tablets earlier in the day.  He then drank 6 beers.  He does not  remember why happening afterwards.  His son called EMS and started CPR.  EMS arrived and noticed that the patient still had a pulse.  They provided naloxone 4 mg IM with immediate results.  He had to receive another 2 mg IVP on the way to the emergency department.  He denies fever, chills, sore throat, rhinorrhea, wheezing or hemoptysis.  He has chest soreness from receiving CPR, but no typical CP, palpitations, diaphoresis, PND, orthopnea or pitting edema of the lower extremities.  Denies abdominal pain, nausea, emesis, diarrhea, constipation, melena or hematochezia.  No dysuria, frequency or materia.  No polyuria, polydipsia, polyphagia or blurred vision.  ED Course: Initial vital signs were temperature 97.9 F, pulse 92, respirations 26, BP 137/94 mmHg O2 sat 92% on 2 LPM via [place].  The patient received 2 mg of naloxone and 4 mg of ondansetron IVP x1 dose.  Lab work: CBC shows a white count 7.2, hemoglobin 10.7 g/dL and platelets 209.  Sodium 127, potassium 2.9, chloride 92 and CO2 18 mmol/L.  Gap is 17.  Glucose 200 [place] and calcium 8.6 mg/dL.  LFTs were normal, except for a total protein of 6.2 g/dL.  Imaging: A one-view portable chest radiograph showed cardiomegaly, but did not show any cardiopulmonary process.  CT head without contrast did not show any acute intracranial normality.  Please see images and full radiology report for further detail.  Brief/Interim Summary: Patient  was admitted to hospital service due to an episode of unresponsiveness which was caused secondary to accidental opioid overdose in conjunction with alcohol intoxication/abuse.  During this short hospitalization, patient was monitored closely.  He did not have any further episodes of unresponsiveness and neither he had any signs or symptoms of alcohol withdrawal.  He is fully alert and oriented.  Patient is very forthright that in order to relieve his pain, he took pain medications more frequently than he was advised to do  so.  He knows that he is advised to take 1 tablet every 6 hours as needed but he took almost 5 tablets within 5 to 6 hours not knowing the consequences.  He denies any suicidal ideations or intentions.  He now understands that he needs to be very careful of taking opioids in conjunction with alcohol.  He is being discharged in stable condition.  Of note he also has very mild hypokalemia for which he received potassium chloride replacement today.  Discharge Diagnoses:  Principal Problem:   Opioid overdose ([place]) Active Problems:   Sleep apnea   Benign prostatic hyperplasia   Hyperlipidemia   Hypertension   GERD (gastroesophageal reflux disease)   Neuropathy of foot   Hypokalemia   Hyponatremia   Normocytic anemia   Poisoning by other opioids, accidental (unintentional), subsequent encounter    Discharge Instructions   Allergies as of 02/23/2021      Reactions   Canagliflozin Other (See Comments)      Medication List    TAKE these medications   aspirin 81 MG tablet Take 81 mg by mouth 3 (three) times a week. Lydia Guiles, [place]   atorvastatin 80 MG tablet Commonly known as: LIPITOR Take 80 mg by mouth daily.   carvedilol 6.25 MG tablet Commonly known as: COREG Take 6.25 mg by mouth 2 (two) times daily with a meal.   cholecalciferol 1000 units tablet Commonly known as: VITAMIN D Take 1,000 Units by mouth daily with breakfast.   DULoxetine 60 MG capsule Commonly known as: CYMBALTA Take 60 mg by mouth daily.   ezetimibe 10 MG tablet Commonly known as: ZETIA Take 10 mg by mouth daily.   fish oil-omega-3 fatty acids 1000 MG capsule Take 1 g by mouth daily with breakfast.   gabapentin 300 MG capsule Commonly known as: NEURONTIN Take 600 mg by mouth 3 (three) times daily.   glipiZIDE 2.5 MG 24 hr tablet Commonly known as: GLUCOTROL XL Take 2.5 mg by mouth 2 (two) times daily.   lisinopril-hydrochlorothiazide 20-12.5 MG tablet Commonly known as: ZESTORETIC Take 1  tablet by mouth daily.   metFORMIN 1000 MG tablet Commonly known as: GLUCOPHAGE Take 1,000 mg by mouth 2 (two) times daily with a meal.   OSTEO BI-FLEX REGULAR STRENGTH PO Take 1,000 mg by mouth.   prazosin 5 MG capsule Commonly known as: MINIPRESS Take 5 mg by mouth at bedtime.   venlafaxine 37.5 MG tablet Commonly known as: EFFEXOR Take 37.5 mg by mouth 2 (two) times daily.   vitamin B-12 1000 MCG tablet Commonly known as: CYANOCOBALAMIN Take 2,000 mcg by mouth daily with breakfast.       Follow-up Information    Crist Infante, MD Follow up in 1 week(s).   Specialty: Internal Medicine Contact information: [place] [address] [phone]              Allergies  Allergen Reactions  . Canagliflozin Other (See Comments)    Consultations: None   Procedures/Studies:  CT Head Wo Contrast  Result Date: 02/21/2021 CLINICAL DATA:  Drug overdose, altered mental status EXAM: CT HEAD WITHOUT CONTRAST TECHNIQUE: Contiguous axial images were obtained from the base of the skull through the vertex without intravenous contrast. COMPARISON:  None. FINDINGS: Brain: No acute intracranial abnormality. Specifically, no hemorrhage, hydrocephalus, mass lesion, acute infarction, or significant intracranial injury. Mild age related volume loss. Vascular: No hyperdense vessel or unexpected calcification. Skull: No acute calvarial abnormality. Sinuses/Orbits: Coastal thickening throughout the paranasal sinuses. No air-fluid levels. Other: None IMPRESSION: No acute intracranial abnormality. Electronically Signed   By: Rolm Baptise M.D.   On: 02/21/2021 22:45   DG Chest Portable 1 View  Result Date: 02/22/2021 CLINICAL DATA:  Shortness of breath. EXAM: PORTABLE CHEST 1 VIEW COMPARISON:  December 10, 2011 FINDINGS: The cardiomediastinal silhouette is enlarged. There is no pneumothorax. No significant pleural effusion. There are likely aortic calcifications. No definite acute  displaced fracture. IMPRESSION: 1. No acute cardiopulmonary process. 2. Enlargement of the cardiac silhouette. Electronically Signed   By: Constance Holster M.D.   On: 02/22/2021 01:52     Discharge Exam: Vitals:   02/22/21 2219 02/23/21 0025  BP: (!) 152/98 (!) 154/88  Pulse:  70  Resp:  17  Temp:    SpO2:  91%   Vitals:   02/22/21 1630 02/22/21 2055 02/22/21 2219 02/23/21 0025  BP: (!) 179/95 (!) 170/91 (!) 152/98 (!) 154/88  Pulse: 79 74  70  Resp: 19 16  17   Temp: 99 F (37.2 C) 97.8 F (36.6 C)    TempSrc:  Oral    SpO2: 96% 98%  91%  Weight:      Height:        General: Pt is alert, awake, not in acute distress, obese Cardiovascular: RRR, S1/S2 +, no rubs, no gallops Respiratory: CTA bilaterally, no wheezing, no rhonchi Abdominal: Soft, NT, ND, bowel sounds + Extremities: no edema, no cyanosis    The results of significant diagnostics from this hospitalization (including imaging, microbiology, ancillary and laboratory) are listed below for reference.     Microbiology: Recent Results (from the past 240 hour(s))  MRSA PCR Screening     Status: Abnormal   Collection Time: 02/22/21  4:33 AM   Specimen: Nasal Mucosa; Nasopharyngeal  Result Value Ref Range Status   MRSA by PCR (A) NEGATIVE Final    INVALID, UNABLE TO DETERMINE THE PRESENCE OF TARGET DUE TO SPECIMEN INTEGRITY. RECOLLECTION REQUESTED.    Comment: RESULT CALLED TO, READ BACK BY AND VERIFIED WITH: RN IISHA,HENDERDON ON 02/22/21 AT 1300 BY KJ Performed at [facility], [place] [address], [place], [place] [address]      Labs: BNP (last 3 results) No results for input(s): BNP in the last 8760 hours. Basic Metabolic Panel: Recent Labs  Lab 02/21/21 2158 02/22/21 0205 02/23/21 0300  NA 127* 130* 134*  K 2.9* 3.3* 3.4*  CL 92* 90* 98  CO2 18* 27 28  GLUCOSE 248* 191* 157*  BUN 13 14 14   CREATININE 0.91 0.86 0.80  CALCIUM 8.6* 9.0 8.8*  MG  --  1.5* 1.7  PHOS  --  4.2  --    Liver  Function Tests: Recent Labs  Lab 02/21/21 2158 02/23/21 0300  AST 22 14*  ALT 15 12  ALKPHOS 53 50  BILITOT 0.4 0.4  PROT 6.2* 5.6*  ALBUMIN 3.6 3.3*   No results for input(s): LIPASE, AMYLASE in the last 168 hours. No results for input(s): AMMONIA  in the last 168 hours. CBC: Recent Labs  Lab 02/21/21 2158 02/23/21 0300  WBC 7.2 6.0  NEUTROABS 5.5  --   HGB 10.7* 9.5*  HCT 33.0* 28.9*  MCV 91.2 89.5  PLT 209 196   Cardiac Enzymes: No results for input(s): CKTOTAL, CKMB, CKMBINDEX, TROPONINI in the last 168 hours. BNP: Invalid input(s): POCBNP CBG: Recent Labs  Lab 02/22/21 0757 02/22/21 1145 02/22/21 1628 02/22/21 2124 02/23/21 0722  GLUCAP 164* 151* 130* 186* 148*   D-Dimer No results for input(s): DDIMER in the last 72 hours. Hgb A1c Recent Labs    02/22/21 0227  HGBA1C 7.5*   Lipid Profile No results for input(s): CHOL, HDL, LDLCALC, TRIG, CHOLHDL, LDLDIRECT in the last 72 hours. Thyroid function studies No results for input(s): TSH, T4TOTAL, T3FREE, THYROIDAB in the last 72 hours.  Invalid input(s): FREET3 Anemia work up Recent Labs    02/22/21 0227 02/22/21 0310  VITAMINB12  --  255  FOLATE  --  17.4  FERRITIN  --  13*  TIBC  --  455*  IRON  --  44*  RETICCTPCT 2.1  --    Urinalysis    Component Value Date/Time   COLORURINE YELLOW 09/14/2015 [place] 09/14/2015 2248   LABSPEC 1.007 09/14/2015 2248   PHURINE 5.0 09/14/2015 2248   GLUCOSEU NEGATIVE 09/14/2015 2248   HGBUR NEGATIVE 09/14/2015 2248   BILIRUBINUR NEGATIVE 09/14/2015 2248   KETONESUR NEGATIVE 09/14/2015 2248   PROTEINUR NEGATIVE 09/14/2015 2248   UROBILINOGEN 0.2 09/14/2015 2248   NITRITE NEGATIVE 09/14/2015 2248   LEUKOCYTESUR NEGATIVE 09/14/2015 2248   Sepsis Labs Invalid input(s): PROCALCITONIN,  WBC,  LACTICIDVEN Microbiology Recent Results (from the past 240 hour(s))  MRSA PCR Screening     Status: Abnormal   Collection Time: 02/22/21  4:33 AM    Specimen: Nasal Mucosa; Nasopharyngeal  Result Value Ref Range Status   MRSA by PCR (A) NEGATIVE Final    INVALID, UNABLE TO DETERMINE THE PRESENCE OF TARGET DUE TO SPECIMEN INTEGRITY. RECOLLECTION REQUESTED.    Comment: RESULT CALLED TO, READ BACK BY AND VERIFIED WITH: RN IISHA,HENDERDON ON 02/22/21 AT 1300 BY KJ Performed at [facility], [place] [address], [place], [place] [address]      Time coordinating discharge: Over 30 minutes  SIGNED:   Darliss Cheney, MD  [facility] 02/23/2021, 9:39 AM  If 7PM-7AM, please contact night-coverage [email or web address]

## 2021-02-23 NOTE — Discharge Instructions (Signed)
Opioid Pain Medicine Management Opioid pain medicines are strong medicines that are used to treat bad or very bad pain. When you take them for a short time, they can help you:  Sleep better.  Do better in physical therapy.  Feel better during the first few days after you get hurt.  Recover from surgery. Only take these medicines if a doctor says that you can. You should only take them for a short time. This is because opioids can be hard to stop taking (they are addictive). The longer you take opioids, the harder it may be to stop taking them (opioid use disorder). What are the risks? Opioids can cause problems (side effects). Taking them for more than 3 days raises your chance of problems, such as:  Trouble pooping (constipation).  Feeling sick to your stomach (nausea).  Vomiting.  Feeling very sleepy.  Confusion.  Not being able to stop taking the medicine.  Breathing problems. Taking opioids for a long time can make it hard for you to do daily tasks. It can also put you at risk for:  Car accidents.  Depression.  Suicide.  Heart attack.  Taking too much of the medicine (overdose), which can sometimes lead to death. What is a pain treatment plan? A pain treatment plan is a plan made by you and your doctor. Work with your doctor to make a plan for treating your pain. To help you do this:  Talk about the goals of your treatment, including: ? How much pain you might expect to have. ? How you will manage the pain.  Talk about the risks and benefits of taking these medicines for your condition.  Remember that a good treatment plan uses more than one approach and lowers the risks of side effects.  Tell your doctor about the amount of medicines you take and about any drug or alcohol use.  Get your pain medicine prescriptions from only one doctor. Pain can be managed with other treatments. Work with your doctor to find other ways to help your pain, such as:  Physical  therapy.  Counseling.  Eating healthy foods.  Brain exercises.  Massage.  Meditation.  Other pain medicines.  Doing gentle exercises. Tapering your use of opioids If you have been taking opioids for more than a few weeks, you may need to slowly decrease (taper) how much you take until you stop taking them. Doing this can lower your chance of having symptoms, such as:  Pain and cramping in your belly (abdomen).  Feeling sick to your stomach.  Sweating.  Feeling very sleepy.  Feeling restless.  Shaking you cannot control (tremors).  Cravings for the medicine. Do not try to stop taking them by yourself. Work with your doctor to stop. Your doctor will help you take less until you are not taking the medicine at all. Follow these instructions at home: Safety and storage  While you are taking opioids: ? Do not drive. ? Do not use machines or power tools. ? Do not sign important papers (legal documents). ? Do not drink alcohol. ? Do not take sleeping pills. ? Do not take care of children by yourself. ? Do not do activities where you need to climb or be in high places, like working on a ladder. ? Do not go into any water, such as a lake, river, ocean, swimming pool, or hot tub.  Keep your opioids locked up or in a place where children cannot reach them.  Do not share your pain   medicine with anyone.   Getting rid of leftover pills Do not save any leftover pills. Get rid of leftover pills safely by:  Taking them to a take-back program in your area.  Bringing them to a pharmacy that has a container for throwing away pills (pill disposal).  Flushing them down the toilet. Check the label or package insert of your medicine to see whether this is safe to do.  Throwing them in the trash. Check the label or package insert of your medicine to see whether this is safe to do. If it is safe to throw them out: 1. Take the pills out of their container. 2. Put the pills into a  container you can seal. 3. Mix the pills with used coffee grounds, food scraps, dirt, or cat litter. 4. Put this in the trash. Activity  Return to your normal activities as told by your doctor. Ask your doctor what activities are safe for you.  Avoid doing things that make your pain worse.  Do exercises as told by your doctor. General instructions  You may need to take these actions to prevent or treat trouble pooping: ? Drink enough fluid to keep your pee (urine) pale yellow. ? Take over-the-counter or prescription medicines. ? Eat foods that are high in fiber. These include beans, whole grains, and fresh fruits and vegetables. ? Limit foods that are high in fat and sugar. These include fried or sweet foods.  Keep all follow-up visits as told by your doctor. This is important. Where to find support If you have been taking opioids for a long time, think about getting help quitting from a local support group or counselor. Ask your doctor about this. Where to find more information  Centers for Disease Control and Prevention (CDC): www.cdc.gov  [place] Food and Drug Administration (FDA): www.fda.gov Get help right away if: Seek medical care right away if you are taking opioids and you, or people close to you, notice any of the following:  You have trouble breathing.  Your breathing is slower or more shallow than normal.  You have a very slow heartbeat.  You feel very confused.  You pass out (faint).  You are very sleepy.  Your speech is not normal.  You feel sick to your stomach and vomit.  You have cold skin.  You have blue lips or fingernails.  Your muscles are weak (limp) and your body seems floppy.  The black centers of your eyes (pupils) are smaller than normal. If you think that you or someone else may have taken too much of an opioid medicine, get medical help right away. Call your local emergency services (911 in the [place] Do not drive yourself to the  hospital. If you ever feel like you may hurt yourself or others, or have thoughts about taking your own life, get help right away. You can go to your nearest emergency department or call:  Your local emergency services (911 in the [place]  The hotline of the National Poison Control Center (1-800-222-1222 in the [place]  A suicide crisis helpline, such as the National Suicide Prevention Lifeline at 1-800-273-8255. This is open 24 hours a day. Summary  Opioid are strong medicines that are used to treat bad or very bad pain.  A pain treatment plan is a plan made by you and your doctor. Work with your doctor to make a plan for treating your pain.  Work with your doctor to find other ways to help your pain.  If you   think that you or someone else may have taken too much of an opioid, get help right away. This information is not intended to replace advice given to you by your health care provider. Make sure you discuss any questions you have with your health care provider. Document Revised: 09/09/2019 Document Reviewed: 12/03/2018 [employer] Patient Education  2021 [employer]  

## 2021-02-25 DIAGNOSIS — M5459 Other low back pain: Secondary | ICD-10-CM | POA: Diagnosis not present

## 2021-03-08 DIAGNOSIS — M48062 Spinal stenosis, lumbar region with neurogenic claudication: Secondary | ICD-10-CM | POA: Diagnosis not present

## 2021-03-28 DIAGNOSIS — M5416 Radiculopathy, lumbar region: Secondary | ICD-10-CM | POA: Diagnosis not present

## 2021-04-02 DIAGNOSIS — I340 Nonrheumatic mitral (valve) insufficiency: Secondary | ICD-10-CM | POA: Diagnosis not present

## 2021-04-02 DIAGNOSIS — E1140 Type 2 diabetes mellitus with diabetic neuropathy, unspecified: Secondary | ICD-10-CM | POA: Diagnosis not present

## 2021-04-02 DIAGNOSIS — I517 Cardiomegaly: Secondary | ICD-10-CM | POA: Diagnosis not present

## 2021-04-02 DIAGNOSIS — Z1212 Encounter for screening for malignant neoplasm of rectum: Secondary | ICD-10-CM | POA: Diagnosis not present

## 2021-04-02 DIAGNOSIS — E1165 Type 2 diabetes mellitus with hyperglycemia: Secondary | ICD-10-CM | POA: Diagnosis not present

## 2021-04-02 DIAGNOSIS — D509 Iron deficiency anemia, unspecified: Secondary | ICD-10-CM | POA: Diagnosis not present

## 2021-04-02 DIAGNOSIS — Z0000 Encounter for general adult medical examination without abnormal findings: Secondary | ICD-10-CM | POA: Diagnosis not present

## 2021-04-02 DIAGNOSIS — M47892 Other spondylosis, cervical region: Secondary | ICD-10-CM | POA: Diagnosis not present

## 2021-04-02 DIAGNOSIS — E785 Hyperlipidemia, unspecified: Secondary | ICD-10-CM | POA: Diagnosis not present

## 2021-04-02 DIAGNOSIS — M25552 Pain in left hip: Secondary | ICD-10-CM | POA: Diagnosis not present

## 2021-04-02 DIAGNOSIS — I10 Essential (primary) hypertension: Secondary | ICD-10-CM | POA: Diagnosis not present

## 2021-04-02 DIAGNOSIS — R011 Cardiac murmur, unspecified: Secondary | ICD-10-CM | POA: Diagnosis not present

## 2021-04-10 DIAGNOSIS — M5416 Radiculopathy, lumbar region: Secondary | ICD-10-CM | POA: Diagnosis not present

## 2021-04-12 DIAGNOSIS — R404 Transient alteration of awareness: Secondary | ICD-10-CM | POA: Diagnosis not present

## 2021-04-17 DIAGNOSIS — 419620001 Death: Secondary | SNOMED CT | POA: Diagnosis not present

## 2021-04-17 DEATH — deceased

## 2021-06-26 ENCOUNTER — Ambulatory Visit: Payer: PPO | Admitting: Cardiology

## 2022-07-26 IMAGING — CT CT HEAD W/O CM
4 series · 17 of 47 positions shown, 19 images · non-contrast
Comparison: None.

CLINICAL DATA: Drug overdose, altered mental status

EXAM:
CT HEAD WITHOUT CONTRAST
TECHNIQUE: Contiguous axial images were obtained from the base of the skull
through the vertex without intravenous contrast.

[Series 3: head without · axial · non-contrast · 0.40mm/px · z∈[+1187,+1322]mm · 7 of 37 slices shown, 9 images]
[im 5/37  brain]
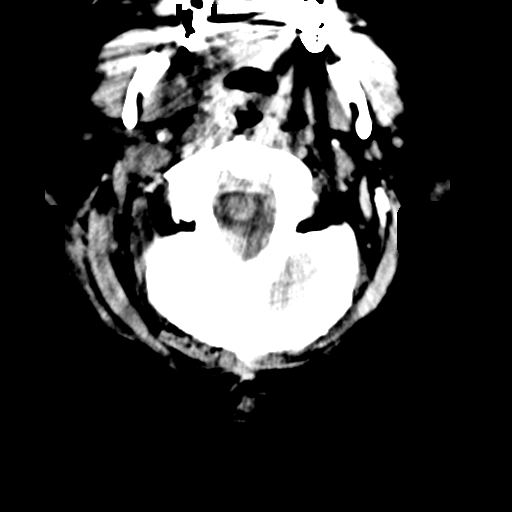
[im 5/37  bone]
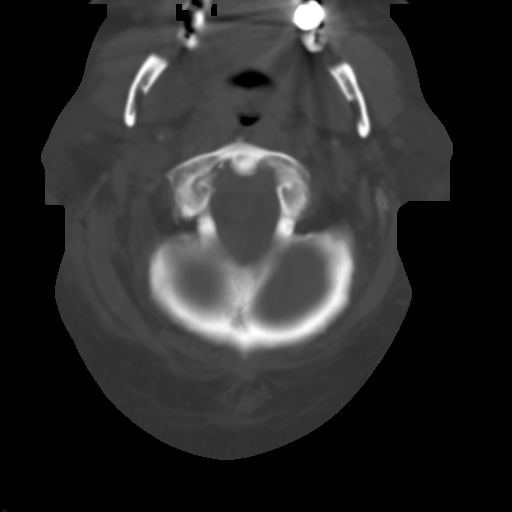
[im 10/37  brain]
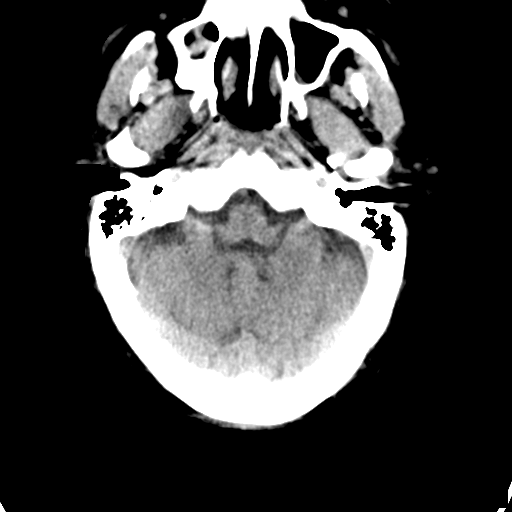
[im 14/37  brain]
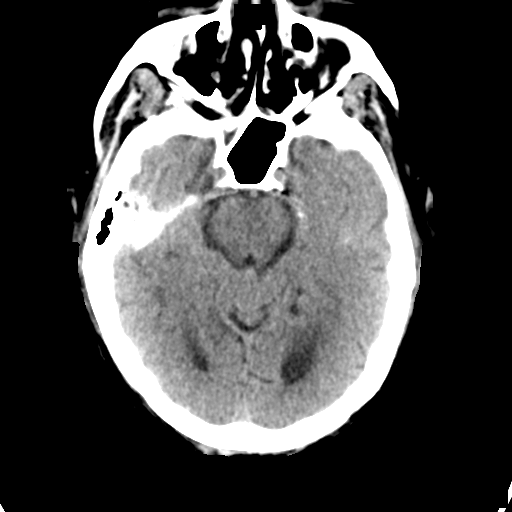
[im 19/37  brain]
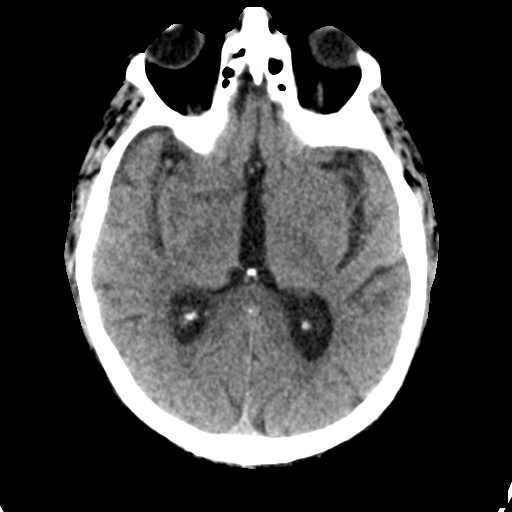
[im 23/37  brain]
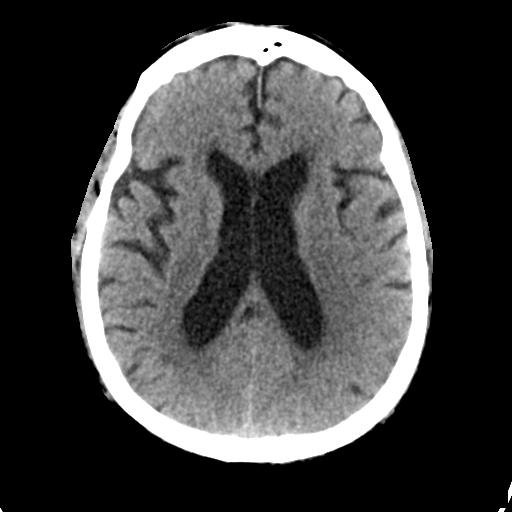
[im 23/37  bone]
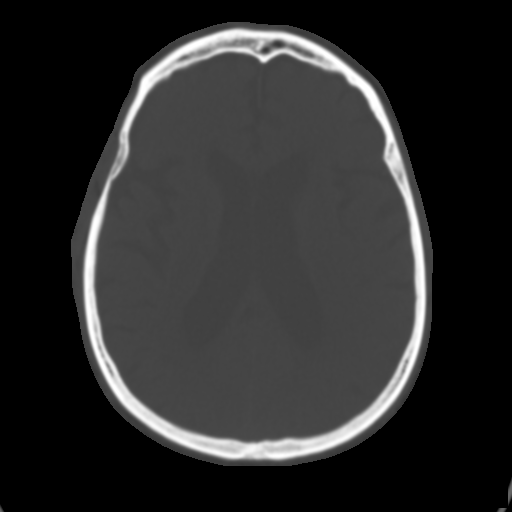
[im 28/37  brain]
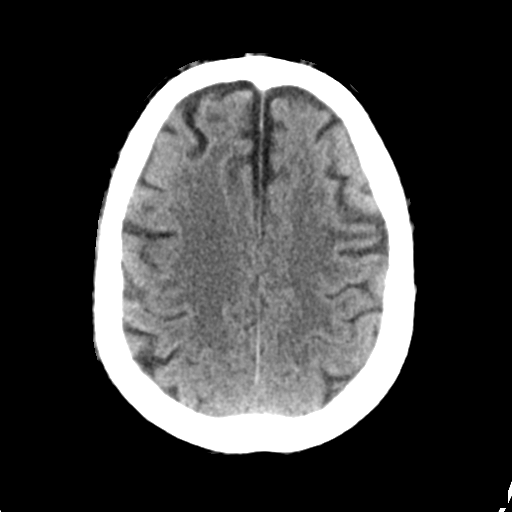
[im 32/37  brain]
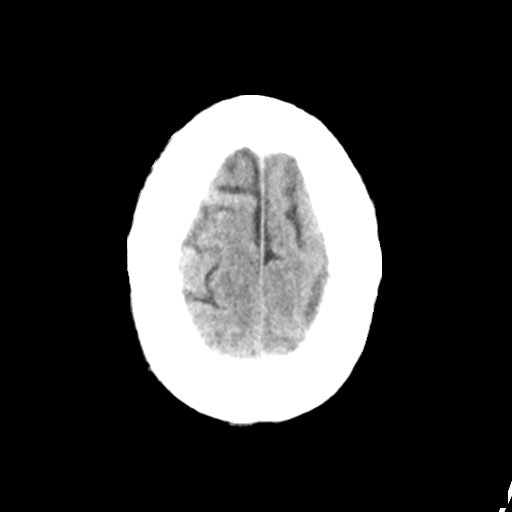

[Series 4: head bone · axial · 0.40mm/px · z∈[+1185,+1247]mm · 4 of 92 slices shown]
[im 10/92  bone]
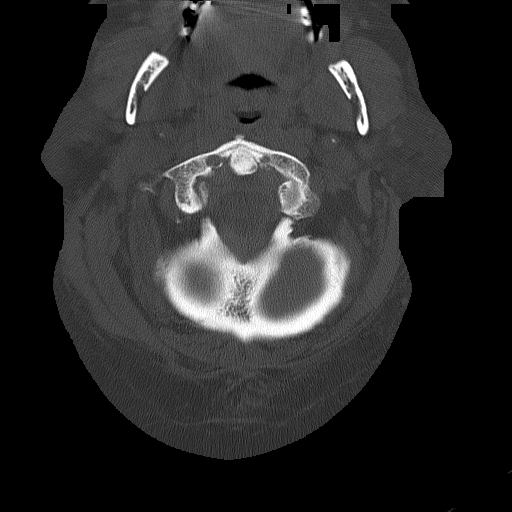
[im 19/92  bone]
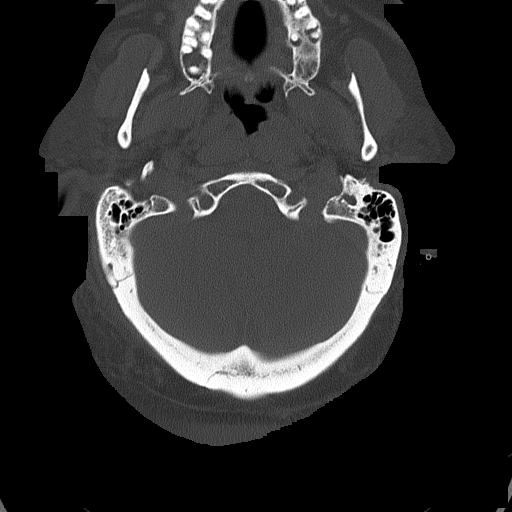
[im 28/92  bone]
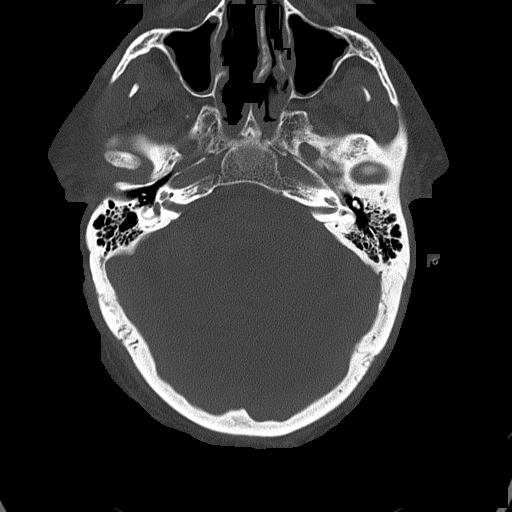
[im 41/92  bone]
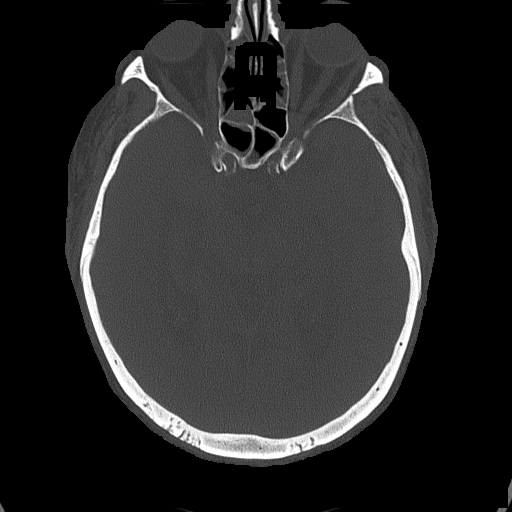

[Series 5: head without cor · coronal · non-contrast · 0.30mm/px · 3 of 70 slices shown]
[im 27/70  brain]
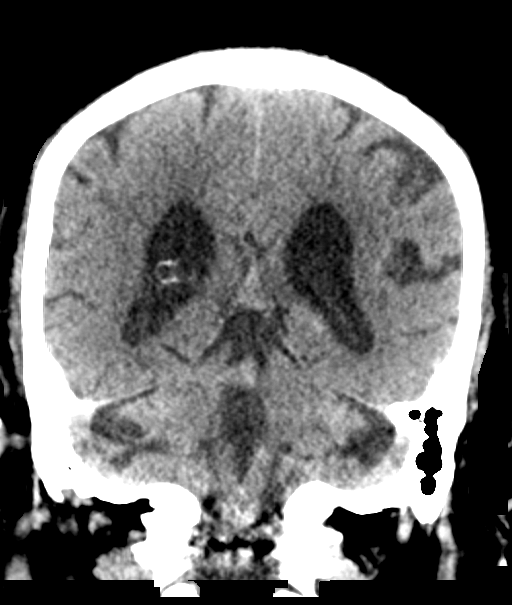
[im 32/70  brain]
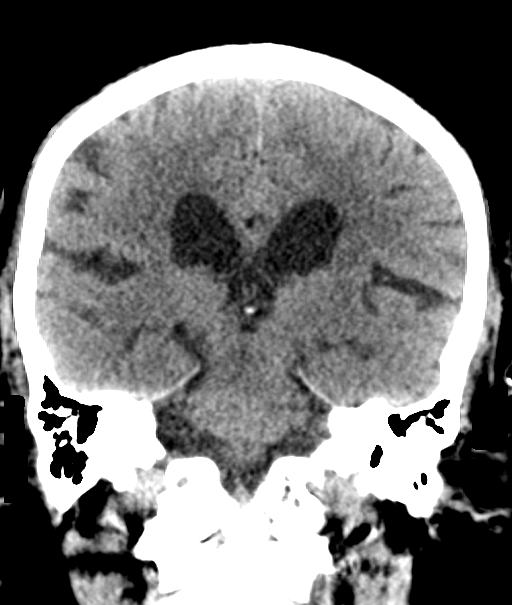
[im 38/70  brain]
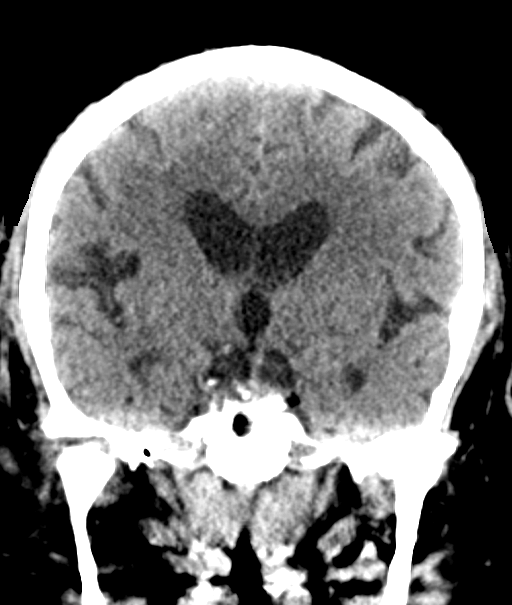

[Series 6: head without sag · sagittal · non-contrast · 0.36mm/px · 3 of 67 slices shown]
[im 23/67  brain]
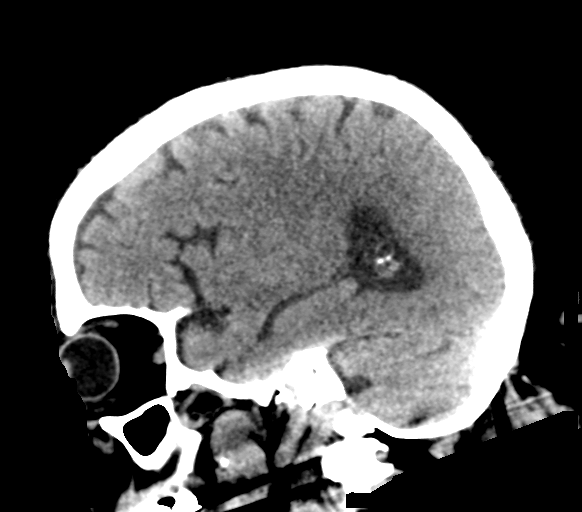
[im 34/67  brain]
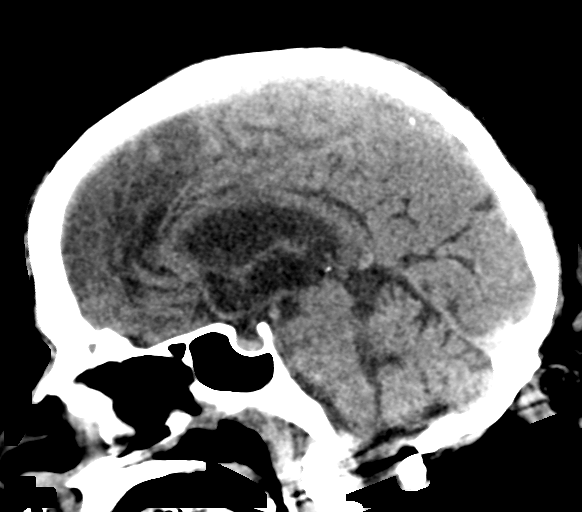
[im 45/67  brain]
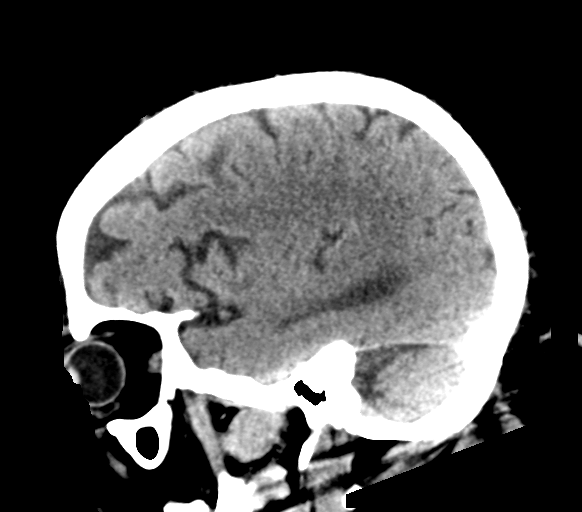

[17 of 47 positions shown; findings below may reference images not displayed]

FINDINGS: Brain: No acute intracranial abnormality. Specifically, no
hemorrhage, hydrocephalus, mass lesion, acute infarction, or
significant intracranial injury. Mild age related volume loss.

Vascular: No hyperdense vessel or unexpected calcification.

Skull: No acute calvarial abnormality.

Sinuses/Orbits: Coastal thickening throughout the paranasal sinuses.
No air-fluid levels.

Other: None
IMPRESSION: No acute intracranial abnormality.

## 2022-07-27 IMAGING — DX DG CHEST 1V PORT
1 series · 1 of 1 positions shown · non-contrast
Comparison: December 10, 2011

CLINICAL DATA: Shortness of breath.

EXAM:
PORTABLE CHEST 1 VIEW

[chest]
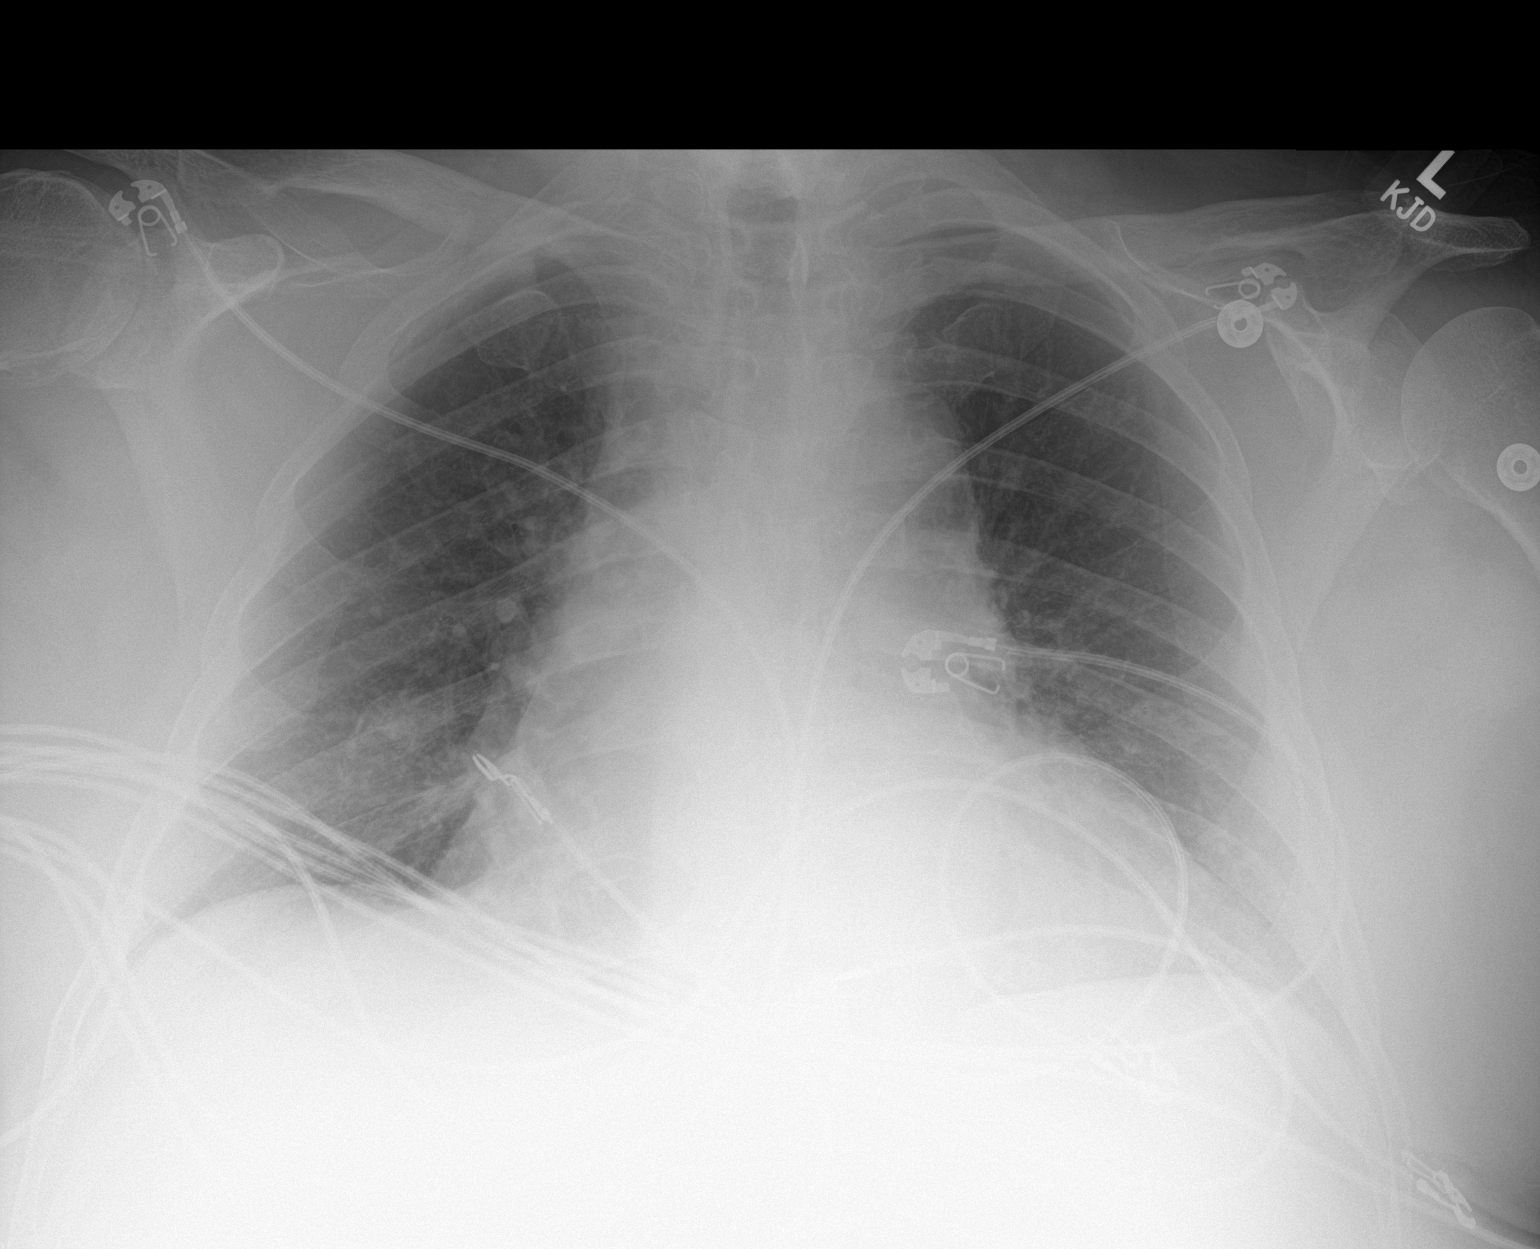

[1 of 1 positions shown; findings below may reference images not displayed]

FINDINGS: The cardiomediastinal silhouette is enlarged. There is no
pneumothorax. No significant pleural effusion. There are likely
aortic calcifications. No definite acute displaced fracture.
IMPRESSION: 1. No acute cardiopulmonary process.
2. Enlargement of the cardiac silhouette.
